# Patient Record
Sex: Male | Born: 1960
Health system: Southern US, Community
[De-identification: ages and names within clinical notes are randomized; demographics above are authoritative.]

## PROBLEM LIST (undated history)

## (undated) DIAGNOSIS — Z720 Tobacco use: Secondary | ICD-10-CM

## (undated) DIAGNOSIS — G709 Myoneural disorder, unspecified: Secondary | ICD-10-CM

## (undated) DIAGNOSIS — R918 Other nonspecific abnormal finding of lung field: Secondary | ICD-10-CM

## (undated) DIAGNOSIS — J449 Chronic obstructive pulmonary disease, unspecified: Secondary | ICD-10-CM

## (undated) DIAGNOSIS — K219 Gastro-esophageal reflux disease without esophagitis: Secondary | ICD-10-CM

## (undated) DIAGNOSIS — M549 Dorsalgia, unspecified: Secondary | ICD-10-CM

## (undated) DIAGNOSIS — J42 Unspecified chronic bronchitis: Secondary | ICD-10-CM

## (undated) HISTORY — PX: BACK SURGERY: SHX140

## (undated) HISTORY — DX: Dorsalgia, unspecified: M54.9

## (undated) HISTORY — DX: Tobacco use: Z72.0

## (undated) HISTORY — DX: Unspecified chronic bronchitis: J42

## (undated) HISTORY — DX: Other nonspecific abnormal finding of lung field: R91.8

---
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
## 2015-10-12 ENCOUNTER — Encounter (HOSPITAL_COMMUNITY): Payer: Self-pay | Admitting: *Deleted

## 2015-10-12 ENCOUNTER — Emergency Department (INDEPENDENT_AMBULATORY_CARE_PROVIDER_SITE_OTHER)
Admission: EM | Admit: 2015-10-12 | Discharge: 2015-10-12 | Disposition: A | Discharge status: Home / Self Care | Payer: BLUE CROSS/BLUE SHIELD | Source: Home / Self Care | Attending: Emergency Medicine | Admitting: Emergency Medicine

## 2015-10-12 ENCOUNTER — Emergency Department (INDEPENDENT_AMBULATORY_CARE_PROVIDER_SITE_OTHER): Payer: BLUE CROSS/BLUE SHIELD

## 2015-10-12 ENCOUNTER — Other Ambulatory Visit (HOSPITAL_COMMUNITY)
Admission: RE | Admit: 2015-10-12 | Discharge: 2015-10-12 | Disposition: A | Discharge status: Home / Self Care | Payer: BLUE CROSS/BLUE SHIELD | Source: Ambulatory Visit | Attending: Emergency Medicine | Admitting: Emergency Medicine

## 2015-10-12 DIAGNOSIS — R0789 Other chest pain: Secondary | ICD-10-CM | POA: Insufficient documentation

## 2015-10-12 DIAGNOSIS — R079 Chest pain, unspecified: Secondary | ICD-10-CM

## 2015-10-12 LAB — POCT RAPID STREP A: Streptococcus, Group A Screen (Direct): NEGATIVE

## 2015-10-12 MED ORDER — AZITHROMYCIN 250 MG PO TABS: 250.0000 mg | ORAL_TABLET | Freq: Every day | ORAL | Status: DC

## 2015-10-12 NOTE — ED Provider Notes (Signed)
CSN: [id number]     Arrival date & time 10/12/15  1641 History   First MD Initiated Contact with Patient 10/12/15 1708     Chief Complaint  Patient presents with  . Fever   (Consider location/radiation/quality/duration/timing/severity/associated sxs/prior Treatment) HPI History obtained from patient:   LOCATION:chest/throat SEVERITY:2 DURATION:1 week CONTEXT:sudden onset pain with swallowing and when taking a deep breath has a burning sensation in the middle of the chest. Last several seconds then goes away. QUALITY: MODIFYING FACTORS:none ASSOCIATED SYMPTOMS:hurts to swallow.  TIMING:constant OCCUPATION:  History reviewed. No pertinent past medical history. History reviewed. No pertinent past surgical history. History reviewed. No pertinent family history. Social History  Substance Use Topics  . Smoking status: Current Every Day Smoker  . Smokeless tobacco: None  . Alcohol Use: Yes    Review of Systems ROS +'ve  Denies: HEADACHE, NAUSEA, ABDOMINAL PAIN, CONGESTION, DYSURIA, SHORTNESS OF BREATH  Allergies  Codeine and Demerol  Home Medications   Prior to Admission medications   Medication Sig Start Date End Date Taking? Authorizing Provider  Acetaminophen (TYLENOL PO) Take by mouth.   Yes Historical Provider, MD  azithromycin (ZITHROMAX) 250 MG tablet Take 1 tablet (250 mg total) by mouth daily. Take first 2 tablets together, then 1 every day until finished. 10/12/15   Tharon AquasFrank C Maeven Mcdougall, PA   Meds Ordered and Administered this Visit  Medications - No data to display  BP 154/120 mmHg  Pulse 129  Temp(Src) 101.2 F (38.4 C) (Oral)  Resp 18  SpO2 100% No data found.   Physical Exam NURSES NOTES AND VITAL SIGNS REVIEWED. CONSTITUTIONAL: Well developed, well nourished, no acute distress HEENT: normocephalic, atraumatic, right and left TM's are normal EYES: Conjunctiva normal NECK:normal ROM, supple, no adenopathy PULMONARY:No respiratory distress, normal  effort, Lungs: CTAb/l, no wheezes, or increased work of breathing CARDIOVASCULAR: RRR, no murmur ABDOMEN: soft, ND, NT, +'ve BS MUSCULOSKELETAL: Normal ROM of all extremities,  SKIN: warm and dry without rash PSYCHIATRIC: Mood and affect, behavior are normal  ED Course  Procedures (including critical care time)  Labs Review Labs Reviewed  POCT RAPID STREP A    Imaging Review Dg Chest 2 View  10/12/2015  CLINICAL DATA:  Pain with deep breaths. Cough, fever, sore throat, and chest pain beginning last night. Smoker. EXAM: CHEST  2 VIEW COMPARISON:  None. FINDINGS: The cardiomediastinal silhouette is within normal limits. The lungs are hyperinflated. There is an approximately 1.2 cm nodular density projecting in the right upper lobe. No segmental airspace consolidation, edema, pleural effusion, pneumothorax is identified. No acute osseous abnormality is seen. IMPRESSION: 1. No evidence of acute cardiopulmonary process. 2. 1.2 cm right upper lobe nodule. Further evaluation with non urgent chest CT recommended. Electronically Signed   By: Sebastian AcheAllen  Grady M.D.   On: 10/12/2015 17:58     Visual Acuity Review  Right Eye Distance:   Left Eye Distance:   Bilateral Distance:    Right Eye Near:   Left Eye Near:    Bilateral Near:      REVIEWED CXR WITH PATIENT AND WIFE NEEDS FOLLOW UP WITH CT CHEST NON EMERGENT   MDM   1. Chest pain of uncertain etiology      Patient is reassured that there are no issues that require transfer to higher level of care at this time or additional tests. Patient is advised to continue home symptomatic treatment. Patient is advised that if there are new or worsening symptoms to attend the emergency department, contact primary  care provider, or return to UC. Instructions of care provided discharged home in stable condition. Return to work/school note provided.   THIS NOTE WAS GENERATED USING A VOICE RECOGNITION SOFTWARE PROGRAM. ALL REASONABLE EFFORTS  WERE  MADE TO PROOFREAD THIS DOCUMENT FOR ACCURACY.  I have verbally reviewed the discharge instructions with the patient. A printed AVS was given to the patient.  All questions were answered prior to discharge.      Tharon Aquas, PA 10/12/15 1820

## 2015-10-12 NOTE — Discharge Instructions (Signed)
FINDINGS: The cardiomediastinal silhouette is within normal limits. The lungs are hyperinflated. There is an approximately 1.2 cm nodular density projecting in the right upper lobe. No segmental airspace consolidation, edema, pleural effusion, pneumothorax is identified. No acute osseous abnormality is seen.  IMPRESSION: 1. No evidence of acute cardiopulmonary process. 2. 1.2 cm right upper lobe nodule. Further evaluation with non urgent chest CT recommended.  YOUR CHEST PAIN IS NON SPECIFIC. IT DOES NOT APPEAR TO BE FROM YOUR HEART AND YOUR STREP TEST IS NEGATIVE AS NOTED ABOVE ON YOUR CHEST X-RAY THERE IS A NODULE IN THE RIGHT UPPER LUNG.  THIS DOES NOT HAVE THE SHAPE OF CANCER BUT SHOULD BE EVALUATED FOR DEFINITIVE DIAGNOSIS. SLOW DOWN ON SMOKING MAY HELP YOUR SYMPTOMS ALSO.

## 2015-10-12 NOTE — ED Notes (Signed)
Pt  Reports      Symptoms  Of  Fever     As     Well  As      Pain  In  Throat  And  Chest     -  Symptoms       Began  Yesterday  Pain  When he   Takes  A  Deep breath  And  Or  Coughs

## 2015-10-15 LAB — CULTURE, GROUP A STREP (THRC)

## 2015-12-13 ENCOUNTER — Ambulatory Visit
Admission: RE | Admit: 2015-12-13 | Discharge: 2015-12-13 | Disposition: A | Discharge status: Home / Self Care | Payer: BLUE CROSS/BLUE SHIELD | Source: Ambulatory Visit | Attending: Family Medicine | Admitting: Family Medicine

## 2015-12-13 ENCOUNTER — Other Ambulatory Visit: Payer: Self-pay | Admitting: Family Medicine

## 2015-12-13 DIAGNOSIS — R911 Solitary pulmonary nodule: Secondary | ICD-10-CM | POA: Diagnosis not present

## 2015-12-13 DIAGNOSIS — J984 Other disorders of lung: Secondary | ICD-10-CM | POA: Diagnosis not present

## 2015-12-13 DIAGNOSIS — F17290 Nicotine dependence, other tobacco product, uncomplicated: Secondary | ICD-10-CM | POA: Diagnosis not present

## 2015-12-13 DIAGNOSIS — Z6820 Body mass index (BMI) 20.0-20.9, adult: Secondary | ICD-10-CM | POA: Diagnosis not present

## 2015-12-13 DIAGNOSIS — J42 Unspecified chronic bronchitis: Secondary | ICD-10-CM | POA: Diagnosis not present

## 2015-12-16 ENCOUNTER — Ambulatory Visit
Admission: RE | Admit: 2015-12-16 | Discharge: 2015-12-16 | Disposition: A | Discharge status: Home / Self Care | Payer: BLUE CROSS/BLUE SHIELD | Source: Ambulatory Visit | Attending: Family Medicine | Admitting: Family Medicine

## 2015-12-16 DIAGNOSIS — R918 Other nonspecific abnormal finding of lung field: Secondary | ICD-10-CM | POA: Diagnosis not present

## 2015-12-16 DIAGNOSIS — R911 Solitary pulmonary nodule: Secondary | ICD-10-CM

## 2015-12-16 MED ORDER — IOPAMIDOL (ISOVUE-300) INJECTION 61%
75.0000 mL | Freq: Once | INTRAVENOUS | Status: AC | PRN
  Administered 2015-12-16: 75 mL via INTRAVENOUS

## 2015-12-28 ENCOUNTER — Institutional Professional Consult (permissible substitution) (INDEPENDENT_AMBULATORY_CARE_PROVIDER_SITE_OTHER): Payer: BLUE CROSS/BLUE SHIELD | Admitting: Cardiothoracic Surgery

## 2015-12-28 ENCOUNTER — Encounter: Payer: Self-pay | Admitting: Cardiothoracic Surgery

## 2015-12-28 ENCOUNTER — Other Ambulatory Visit: Payer: Self-pay | Admitting: *Deleted

## 2015-12-28 VITALS — BP 165/98 | HR 88 | Resp 20 | Ht 71.0 in | Wt 154.0 lb

## 2015-12-28 DIAGNOSIS — R918 Other nonspecific abnormal finding of lung field: Secondary | ICD-10-CM | POA: Diagnosis not present

## 2015-12-28 NOTE — Progress Notes (Signed)
[address]       [place] [address]             [phone]                    [name] Ross [facility] Medical Record #[id number] Date of Birth: 10-14-1960  Referring: Lewis Moccasin, MD Primary Care: Maryelizabeth Rowan, MD  Chief Complaint:    Chief Complaint  Patient presents with  . Lung Mass    Surgical eval, Chest CT 12/18/15    History of Present Illness:    [name] Ross 55 y.o. male is seen in the office  today for Abnormal chest x-ray and CT scan. The patient had a chest x-ray in March of this year demonstrating a right upper lobe lung nodule. He was seen in urgent care because of cough and pulmonary complaints. Follow-up chest x-ray done in the primary care office in late May showed persistent of right upper lobe lung lesion, this is confirmed on CT scan. The patient is a long-term at least a pack a day for more than 30 years. He does have environmental/work exposure to dust but is not aware of any specific asbestos exposure. He denies any previous cardiac history, denies tuberculosis exposure.     Current Activity/ Functional Status:  Patient is independent with mobility/ambulation, transfers, ADL's, IADL's.   Zubrod Score: At the time of surgery this patient's most appropriate activity status/level should be described as:     0    Normal activity, no symptoms     1    Restricted in physical strenuous activity but ambulatory, able to do out light work     2    Ambulatory and capable of self care, unable to do work activities, up and about               >50 % of waking hours                                  3    Only limited self care, in bed greater than 50% of waking hours     4    Completely disabled, no self care, confined to bed or chair     5    Moribund   Past Medical History  Diagnosis Date  . Multiple lung nodules   . Back pain   . Tobacco abuse   . Chronic bronchitis (HCC)     No past surgical history  on file.  No family history on file.  Social History   Social History  . Marital Status: Single    Spouse Name: N/A  . Number of Children: N/A  . Years of Education: N/A   Occupational History  . Not on file.   Social History Main Topics  . Smoking status: Current Every Day Smoker  . Smokeless tobacco: Not on file  . Alcohol Use: Yes  . Drug Use: Not on file  . Sexual Activity: Not on file   Other Topics Concern  . Not on file   Social History Narrative    History  Smoking status  . Current Every Day Smoker  Smokeless tobacco  . Not on file    History  Alcohol Use  . Yes     Allergies  Allergen Reactions  . Codeine   . Demerol [Meperidine]  Current Outpatient Prescriptions  Medication Sig Dispense Refill  . Acetaminophen (TYLENOL PO) Take by mouth.     No current facility-administered medications for this visit.      Review of Systems:     Cardiac Review of Systems: Y or N  Chest Pain [  n  ]  Resting SOB [ n  ] Exertional SOB  [ n ]  Orthopnea [ n ]   Pedal Edema [n   ]    Palpitations [n  ] Syncope  [ n ]   Presyncope [   n]  General Review of Systems: [Y] = yes [  ]=no Constitional: recent weight change [  n];  Wt loss over the last 3 months [   ] anorexia [  ]; fatigue [  ]; nausea [  ]; night sweats [  ]; fever [  ]; or chills [  ];          Dental: poor dentition[  ]; Last Dentist visit: dentures  Eye : blurred vision [  ]; diplopia [   ]; vision changes [  ];  Amaurosis fugax[  ]; Resp: cough [  ];  wheezing[  ];  hemoptysis[  ]; shortness of breath[  ]; paroxysmal nocturnal dyspnea[  ]; dyspnea on exertion[  ]; or orthopnea[  ];  GI:  gallstones[  ], vomiting[  ];  dysphagia[  ]; melena[  ];  hematochezia [  ]; heartburn[  ];   Hx of  Colonoscopy[  ]; GU: kidney stones [  ]; hematuria[  ];   dysuria [  ];  nocturia[  ];  history of     obstruction [  ]; urinary frequency [  ]             Skin: rash, swelling[  ];, hair loss[  ];  peripheral  edema[  ];  or itching[  ]; Musculosketetal: myalgias[  ];  joint swelling[  ];  joint erythema[  ];  joint pain[  ];  back pain[  ];  Heme/Lymph: bruising[  ];  bleeding[  ];  anemia[  ];  Neuro: TIA[  ];  headaches[  ];  stroke[  ];  vertigo[  ];  seizures[  ];   paresthesias[  ];  difficulty walking[n  ];  Psych:depression[  ]; anxiety[  ];  Endocrine: diabetes[ n ];  thyroid dysfunction[n  ];  Immunizations: Flu up to date [?  ]; Pneumococcal up to date [ ? ];  Other:  Physical Exam: BP 165/98 mmHg  Pulse 88  Resp 20  Ht 5\' 11"  (1.803 m)  Wt 154 lb (69.854 kg)  BMI 21.49 kg/m2  SpO2 97%  PHYSICAL EXAMINATION: General appearance: alert, cooperative, appears stated age and no distress Head: Normocephalic, without obvious abnormality, atraumatic Neck: no adenopathy, no carotid bruit, no JVD, supple, symmetrical, trachea midline and thyroid not enlarged, symmetric, no tenderness/mass/nodules Lymph nodes: Cervical, supraclavicular, and axillary nodes normal. Resp: clear to auscultation bilaterally Back: symmetric, no curvature. ROM normal. No CVA tenderness. Cardio: regular rate and rhythm, S1, S2 normal, no murmur, click, rub or gallop GI: soft, non-tender; bowel sounds normal; no masses,  no organomegaly Extremities: extremities normal, atraumatic, no cyanosis or edema and Homans sign is negative, no sign of DVT Neurologic: Grossly normal  Diagnostic Studies & Laboratory data:     Recent Radiology Findings:   Dg Chest 2 View  12/13/2015  CLINICAL DATA:  Followup lung nodule, patient smokes EXAM: CHEST  2  VIEW COMPARISON:  10/12/2015 FINDINGS: Irregular 12 mm right upper lobe nodule persists. Heart size and vascular pattern normal. Left lung is clear. No pleural effusions. IMPRESSION: Persistent irregular 12 mm upper lobe pulmonary nodule on the right. CT thorax preferably with contrast is suggested. These results will be called to the ordering clinician or representative by the  Radiologist Assistant, and communication documented in the PACS or zVision Dashboard. Electronically Signed   By: Esperanza Heir M.D.   On: 12/13/2015 14:15   Ct Chest W Contrast  12/16/2015  CLINICAL DATA:  Lung nodule on recent chest x-ray EXAM: CT CHEST WITH CONTRAST TECHNIQUE: Multidetector CT imaging of the chest was performed during intravenous contrast administration. CONTRAST:  75mL ISOVUE-300 IOPAMIDOL (ISOVUE-300) INJECTION 61% COMPARISON:  12/13/2015 FINDINGS: The lungs are well aerated bilaterally. Diffuse emphysematous changes are identified. The left lung shows no evidence of focal infiltrate. The right lung demonstrates an 8 mm nodular density best seen on image number 69 of series 6 as well as a irregular spiculated lesion in the right upper lobe posteriorly which measures at least 2 cm in greatest dimension. Surrounding architectural distortion is noted in these changes are highly suspicious for underlying neoplasm. The thoracic inlet is within normal limits. Aortic calcifications are seen without aneurysmal dilatation or dissection. The pulmonary artery shows no large central pulmonary embolus. Scattered small mediastinal lymph nodes are seen. A single 10 mm short axis lymph node is noted in the right hilum. The visualized upper abdomen demonstrates cystic changes of the left kidney and liver. A 9 mm short axis portacaval node is noted as well. The osseous structures show no acute abnormality. IMPRESSION: Multiple nodules within the right upper lobe. The largest of these measures at least 2 cm with significant architectural distortion and spiculation. These changes are consistent with pulmonary neoplasm till proven otherwise. Tissue sampling and further workup with a PET-CT may be helpful. These results will be called to the ordering clinician or representative by the Radiologist Assistant, and communication documented in the PACS or zVision Dashboard. Electronically Signed   By: Alcide Clever  M.D.   On: 12/16/2015 16:37     I have independently reviewed the above radiology studies  and reviewed the findings with the patient.   Recent Lab Findings: No results found for: WBC, HGB, HCT, PLT, GLUCOSE, CHOL, TRIG, HDL, LDLDIRECT, LDLCALC, ALT, AST, NA, K, CL, CREATININE, BUN, CO2, TSH, INR, GLUF, HGBA1C    Assessment / Plan:   1. Multiple lung nodules right upper lobe and long-term smoker, suggestive of clinical stage IIb (two separate lung nodules same lobe), assuming no nodal involvement. I discussed with patient and his family and reviewed the films with them a probable diagnosis. I recommended that we proceed with PET scan to better stage and pulmonary function studies once this is completed we will then decide whether it proceed with primary surgery or tissue diagnosis other means. 2. Patient is a long-term smoker and was canceled about smoking sensation, his workplace provides smoking cessation program and he will enroll tomorrow.      I  spent 40 minutes counseling the patient face to face and 50% or more the  time was spent in counseling and coordination of care. The total time spent in the appointment was 60 minutes.  Delight Ovens MD      [address] [place] [place] [address] Office [phone]   Beeper [phone]  12/28/2015 3:46 PM

## 2015-12-28 NOTE — Patient Instructions (Signed)
Pulmonary Nodule A pulmonary nodule is a small, round growth of tissue in the lung. Pulmonary nodules can range in size from less than 1/5 inch (4 mm) to a little bigger than an inch (25 mm). Most pulmonary nodules are detected when imaging tests of the lung are being performed for a different problem. Pulmonary nodules are usually not cancerous (benign). However, some pulmonary nodules are cancerous (malignant). Follow-up treatment or testing is based on the size of the pulmonary nodule and your risk of getting lung cancer.  CAUSES Benign pulmonary nodules can be caused by various things. Some of the causes include:   Bacterial, fungal, or viral infections. This is usually an old infection that is no longer active, but it can sometimes be a current, active infection.  A benign mass of tissue.  Inflammation from conditions such as rheumatoid arthritis.   Abnormal blood vessels in the lungs. Malignant pulmonary nodules can result from lung cancer or from cancers that spread to the lung from other places in the body. SIGNS AND SYMPTOMS Pulmonary nodules usually do not cause symptoms. DIAGNOSIS Most often, pulmonary nodules are found incidentally when an X-ray or CT scan is performed to look for some other problem in the lung area. To help determine whether a pulmonary nodule is benign or malignant, your health care provider will take a medical history and order a variety of tests. Tests done may include:   Blood tests.  A skin test called a tuberculin test. This test is used to determine if you have been exposed to the germ that causes tuberculosis.   Chest X-rays. If possible, a new X-ray may be compared with X-rays you have had in the past.   CT scan. This test shows smaller pulmonary nodules more clearly than an X-ray.   Positron emission tomography (PET) scan. In this test, a safe amount of a radioactive substance is injected into the bloodstream. Then, the scan takes a picture of  the pulmonary nodule. The radioactive substance is eliminated from your body in your urine.   Biopsy. A tiny piece of the pulmonary nodule is removed so it can be checked under a microscope. TREATMENT  Pulmonary nodules that are benign normally do not require any treatment because they usually do not cause symptoms or breathing problems. Your health care provider may want to monitor the pulmonary nodule through follow-up CT scans. The frequency of these CT scans will vary based on the size of the nodule and the risk factors for lung cancer. For example, CT scans will need to be done more frequently if the pulmonary nodule is larger and if you have a history of smoking and a family history of cancer. Further testing or biopsies may be done if any follow-up CT scan shows that the size of the pulmonary nodule has increased. HOME CARE INSTRUCTIONS  Only take over-the-counter or prescription medicines as directed by your health care provider.  Keep all follow-up appointments with your health care provider. SEEK MEDICAL CARE IF:  You have trouble breathing when you are active.   You feel sick or unusually tired.   You do not feel like eating.   You lose weight without trying to.   You develop chills or night sweats.  SEEK IMMEDIATE MEDICAL CARE IF:  You cannot catch your breath, or you begin wheezing.   You cannot stop coughing.   You cough up blood.   You become dizzy or feel like you are going to pass out.   You   have sudden chest pain.   You have a fever or persistent symptoms for more than 2-3 days.   You have a fever and your symptoms suddenly get worse. MAKE SURE YOU:  Understand these instructions.  Will watch your condition.  Will get help right away if you are not doing well or get worse.   This information is not intended to replace advice given to you by your health care provider. Make sure you discuss any questions you have with your health care  provider.   Document Released: 05/06/2009 Document Revised: 03/11/2013 Document Reviewed: 12/29/2012 Elsevier Interactive Patient Education 2016 [employer]   Lung Cancer Lung cancer occurs when abnormal cells in the lung grow out of control and form a mass (tumor). There are several types of lung cancer. The two most common types are:  Non-small cell. In this type of lung cancer, abnormal cells are larger and grow more slowly than those of small cell lung cancer.  Small cell. In this type of lung cancer, abnormal cells are smaller than those of non-small cell lung cancer. Small cell lung cancer gets worse faster than non-small cell lung cancer. CAUSES  The leading cause of lung cancer is smoking tobacco. The second leading cause is radon exposure. RISK FACTORS  Smoking tobacco.  Exposure to secondhand tobacco smoke.  Exposure to radon gas.  Exposure to asbestos.  Exposure to arsenic in drinking water.  Air pollution.  Family or personal history of lung cancer.  Lung radiation therapy.  Being older than 65 years. SIGNS AND SYMPTOMS  In the early stages, symptoms may not be present. As the cancer progresses, symptoms may include:  A lasting cough, possibly with blood.  Fatigue.  Unexplained weight loss.  Shortness of breath.  Wheezing.  Chest pain.  Loss of appetite. Symptoms of advanced lung cancer include:  Hoarseness.  Bone or joint pain.  Weakness.  Nail problems.  Face or arm swelling.  Paralysis of the face.  Drooping eyelids. DIAGNOSIS  Lung cancer can be identified with a physical exam and with tests such as:  A chest X-ray.  A CT scan.  Blood tests.  A biopsy. After a diagnosis is made, you will have more tests to determine the stage of the cancer. The stages of non-small cell lung cancer are:  Stage 0, also called carcinoma in situ. At this stage, abnormal cells are found in the inner lining of your lung or lungs.  Stage I. At  this stage, abnormal cells have grown into a tumor that is no larger than 5 cm across. The cancer has entered the deeper lung tissue but has not yet entered the lymph nodes or other parts of the body.  Stage II. At this stage, the tumor is 7 cm across or smaller and has entered nearby lymph nodes. Or, the tumor is 5 cm across or smaller and has invaded surrounding tissue but is not found in nearby lymph nodes. There may be more than one tumor present.  Stage III. At this stage, the tumor may be any size. There may be more than one tumor in the lungs. The cancer cells have spread to the lymph nodes and possibly to other organs.  Stage IV. At this stage, there are tumors in both lungs and the cancer has spread to other areas of the body. The stages of small cell lung cancer are:  Limited. At this stage, the cancer is found only on one side of the chest.  Extensive. At   this stage, the cancer is in the lungs and in tissues on the other side of the chest. The cancer has spread to other organs or is found in the fluid between the layers of your lungs. TREATMENT  Depending on the type and stage of your lung cancer, you may be treated with:  Surgery. This is done to remove a tumor.  Radiation therapy. This treatment destroys cancer cells using X-rays or other types of radiation.  Chemotherapy. This treatment uses medicines to destroy cancer cells.  Targeted therapy. This treatment aims to destroy only cancer cells instead of all cells as other therapies do. You may also have a combination of treatments. HOME CARE INSTRUCTIONS   Do not use any tobacco products. This includes cigarettes, chewing tobacco, and electronic cigarettes. If you need help quitting, ask your health care provider.  Take medicines only as directed by your health care provider.  Eat a healthy diet. Work with a dietitian to make sure you are getting the nutrition you need.  Consider joining a support group or seeking  counseling to help you cope with the stress of having lung cancer.  Let your cancer specialist (oncologist) know if you are admitted to the hospital.  Keep all follow-up visits as directed by your health care provider. This is important. SEEK MEDICAL CARE IF:   You lose weight without trying.  You have a persistent cough and wheezing.  You feel short of breath.  You tire easily.  You experience bone or joint pain.  You have difficulty swallowing.  You feel hoarse or notice your voice changing.  Your pain medicine is not helping. SEEK IMMEDIATE MEDICAL CARE IF:   You cough up blood.  You have new breathing problems.  You develop chest pain.  You develop swelling in:  One or both ankles or legs.  Your face, neck, or arms.  You are confused.  You experience paralysis in your face or a drooping eyelid.   This information is not intended to replace advice given to you by your health care provider. Make sure you discuss any questions you have with your health care provider.   Document Released: 10/15/2000 Document Revised: 03/30/2015 Document Reviewed: 11/12/2013 Elsevier Interactive Patient Education 2016 [employer] Inc[employer]    

## 2015-12-30 ENCOUNTER — Encounter (HOSPITAL_COMMUNITY): Payer: BLUE CROSS/BLUE SHIELD

## 2016-01-03 ENCOUNTER — Ambulatory Visit: Payer: BLUE CROSS/BLUE SHIELD | Admitting: Cardiothoracic Surgery

## 2016-01-06 ENCOUNTER — Ambulatory Visit (HOSPITAL_COMMUNITY)
Admission: RE | Admit: 2016-01-06 | Discharge: 2016-01-06 | Disposition: A | Discharge status: Home / Self Care | Payer: BLUE CROSS/BLUE SHIELD | Source: Ambulatory Visit | Attending: Cardiothoracic Surgery | Admitting: Cardiothoracic Surgery

## 2016-01-06 ENCOUNTER — Ambulatory Visit (HOSPITAL_COMMUNITY): Payer: BLUE CROSS/BLUE SHIELD

## 2016-01-06 DIAGNOSIS — R918 Other nonspecific abnormal finding of lung field: Secondary | ICD-10-CM | POA: Insufficient documentation

## 2016-01-06 LAB — PULMONARY FUNCTION TEST
DL/VA % pred: 61 %
DL/VA: 2.9 ml/min/mmHg/L
DLCO unc % pred: 58 %
DLCO unc: 19.72 ml/min/mmHg
FEF 25-75 Post: 2.38 L/sec
FEF 25-75 Pre: 1.35 L/sec
FEF2575-%Change-Post: 75 %
FEF2575-%Pred-Post: 73 %
FEF2575-%Pred-Pre: 41 %
FEV1-%Change-Post: 18 %
FEV1-%Pred-Post: 72 %
FEV1-%Pred-Pre: 60 %
FEV1-Post: 2.78 L
FEV1-Pre: 2.34 L
FEV1FVC-%Change-Post: -6 %
FEV1FVC-%Pred-Pre: 86 %
FEV6-%Change-Post: 16 %
FEV6-%Pred-Post: 84 %
FEV6-%Pred-Pre: 72 %
FEV6-Post: 4.08 L
FEV6-Pre: 3.51 L
FEV6FVC-%Change-Post: -8 %
FEV6FVC-%Pred-Post: 93 %
FEV6FVC-%Pred-Pre: 102 %
FVC-%Change-Post: 27 %
FVC-%Pred-Post: 89 %
FVC-%Pred-Pre: 70 %
FVC-Post: 4.52 L
FVC-Pre: 3.56 L
Post FEV1/FVC ratio: 61 %
Post FEV6/FVC ratio: 90 %
Pre FEV1/FVC ratio: 66 %
Pre FEV6/FVC Ratio: 98 %
RV % pred: 134 %
RV: 2.98 L
TLC % pred: 98 %
TLC: 7.09 L

## 2016-01-06 MED ORDER — ALBUTEROL SULFATE (2.5 MG/3ML) 0.083% IN NEBU
2.5000 mg | INHALATION_SOLUTION | Freq: Once | RESPIRATORY_TRACT | Status: AC
  Administered 2016-01-06: 2.5 mg via RESPIRATORY_TRACT

## 2016-01-09 ENCOUNTER — Ambulatory Visit: Payer: BLUE CROSS/BLUE SHIELD | Admitting: Cardiothoracic Surgery

## 2016-01-10 ENCOUNTER — Ambulatory Visit
Admission: RE | Admit: 2016-01-10 | Discharge: 2016-01-10 | Disposition: A | Discharge status: Home / Self Care | Payer: BLUE CROSS/BLUE SHIELD | Source: Ambulatory Visit | Attending: Cardiothoracic Surgery | Admitting: Cardiothoracic Surgery

## 2016-01-10 DIAGNOSIS — K5790 Diverticulosis of intestine, part unspecified, without perforation or abscess without bleeding: Secondary | ICD-10-CM | POA: Insufficient documentation

## 2016-01-10 DIAGNOSIS — R918 Other nonspecific abnormal finding of lung field: Secondary | ICD-10-CM | POA: Insufficient documentation

## 2016-01-10 DIAGNOSIS — I700 Atherosclerosis of aorta: Secondary | ICD-10-CM | POA: Insufficient documentation

## 2016-01-10 DIAGNOSIS — R933 Abnormal findings on diagnostic imaging of other parts of digestive tract: Secondary | ICD-10-CM | POA: Insufficient documentation

## 2016-01-10 LAB — GLUCOSE, CAPILLARY: Glucose-Capillary: 91 mg/dL (ref 65–99)

## 2016-01-10 MED ORDER — FLUDEOXYGLUCOSE F - 18 (FDG) INJECTION
12.9900 | Freq: Once | INTRAVENOUS | Status: AC | PRN
  Administered 2016-01-10: 12.99 via INTRAVENOUS

## 2016-01-12 ENCOUNTER — Encounter: Payer: Self-pay | Admitting: Cardiothoracic Surgery

## 2016-01-12 ENCOUNTER — Ambulatory Visit (INDEPENDENT_AMBULATORY_CARE_PROVIDER_SITE_OTHER): Payer: BLUE CROSS/BLUE SHIELD | Admitting: Cardiothoracic Surgery

## 2016-01-12 VITALS — BP 156/85 | HR 81 | Resp 16 | Ht 71.0 in | Wt 154.0 lb

## 2016-01-12 DIAGNOSIS — R918 Other nonspecific abnormal finding of lung field: Secondary | ICD-10-CM | POA: Diagnosis not present

## 2016-01-12 NOTE — Progress Notes (Signed)
[address]       [place] [address]             [phone]                    [name] Ross [facility] Medical Record #[id number] Date of Birth: 1961/02/18  Referring: Lewis Moccasin, MD Primary Care: Maryelizabeth Rowan, MD  Chief Complaint:    Chief Complaint  Patient presents with  . Lung Mass    further discuss surgery, PET Scan 01/10/16, PFT's 01/07/16     History of Present Illness:    [name] Ross 55 y.o. male is seen in the office  today for Abnormal chest x-ray and CT scan. The patient had a chest x-ray in March of this year demonstrating a right upper lobe lung nodule. He was seen in urgent care because of cough and pulmonary complaints. Follow-up chest x-ray done in the primary care office in late May showed persistent of right upper lobe lung lesion, this is confirmed on CT scan. The patient is a long-term at least a pack a day for more than 30 years. He does have environmental/work exposure to dust but is not aware of any specific asbestos exposure. He denies any previous cardiac history, denies tuberculosis exposure.     Current Activity/ Functional Status:  Patient is independent with mobility/ambulation, transfers, ADL's, IADL's.   Zubrod Score: At the time of surgery this patient's most appropriate activity status/level should be described as: [x]     0    Normal activity, no symptoms []     1    Restricted in physical strenuous activity but ambulatory, able to do out light work []     2    Ambulatory and capable of self care, unable to do work activities, up and about               >50 % of waking hours                              []     3    Only limited self care, in bed greater than 50% of waking hours []     4    Completely disabled, no self care, confined to bed or chair []     5    Moribund   Past Medical History  Diagnosis Date  . Multiple lung nodules   . Back pain   . Tobacco abuse   . Chronic bronchitis (HCC)      No past surgical history on file.  No family history on file.  Social History   Social History  . Marital Status: Single    Spouse Name: N/A  . Number of Children: N/A  . Years of Education: N/A   Occupational History  . Not on file.   Social History Main Topics  . Smoking status: Current Every Day Smoker  . Smokeless tobacco: Not on file  . Alcohol Use: Yes  . Drug Use: Not on file  . Sexual Activity: Not on file   Other Topics Concern  . Not on file   Social History Narrative    History  Smoking status  . Current Every Day Smoker  Smokeless tobacco  . Not on file    History  Alcohol Use  . Yes     Allergies  Allergen Reactions  . Codeine   . Demerol [  Meperidine]     Current Outpatient Prescriptions  Medication Sig Dispense Refill  . Acetaminophen (TYLENOL PO) Take by mouth.     No current facility-administered medications for this visit.      Review of Systems:     Cardiac Review of Systems: Y or N  Chest Pain [  n  ]  Resting SOB [ n  ] Exertional SOB  [ n ]  Orthopnea [ n ]   Pedal Edema [n   ]    Palpitations [n  ] Syncope  [ n ]   Presyncope [   n]  General Review of Systems: [Y] = yes [  ]=no Constitional: recent weight change [  n];  Wt loss over the last 3 months [   ] anorexia [  ]; fatigue [  ]; nausea [  ]; night sweats [  ]; fever [  ]; or chills [  ];          Dental: poor dentition[  ]; Last Dentist visit: dentures  Eye : blurred vision [  ]; diplopia [   ]; vision changes [  ];  Amaurosis fugax[  ]; Resp: cough [  ];  wheezing[  ];  hemoptysis[  ]; shortness of breath[  ]; paroxysmal nocturnal dyspnea[  ]; dyspnea on exertion[  ]; or orthopnea[  ];  GI:  gallstones[  ], vomiting[  ];  dysphagia[  ]; melena[  ];  hematochezia [  ]; heartburn[  ];   Hx of  Colonoscopy[  ]; GU: kidney stones [  ]; hematuria[  ];   dysuria [  ];  nocturia[  ];  history of     obstruction [  ]; urinary frequency [  ]             Skin: rash, swelling[   ];, hair loss[  ];  peripheral edema[  ];  or itching[  ]; Musculosketetal: myalgias[  ];  joint swelling[  ];  joint erythema[  ];  joint pain[  ];  back pain[  ];  Heme/Lymph: bruising[  ];  bleeding[  ];  anemia[  ];  Neuro: TIA[  ];  headaches[  ];  stroke[  ];  vertigo[  ];  seizures[  ];   paresthesias[  ];  difficulty walking[n  ];  Psych:depression[  ]; anxiety[  ];  Endocrine: diabetes[ n ];  thyroid dysfunction[n  ];  Immunizations: Flu up to date [?  ]; Pneumococcal up to date [ ? ];  Other:  Physical Exam: BP 156/85 mmHg  Pulse 81  Resp 16  Ht 5\' 11"  (1.803 m)  Wt 154 lb (69.854 kg)  BMI 21.49 kg/m2  SpO2 97%  PHYSICAL EXAMINATION: General appearance: alert, cooperative, appears stated age and no distress Head: Normocephalic, without obvious abnormality, atraumatic Neck: no adenopathy, no carotid bruit, no JVD, supple, symmetrical, trachea midline and thyroid not enlarged, symmetric, no tenderness/mass/nodules Lymph nodes: Cervical, supraclavicular, and axillary nodes normal. Resp: clear to auscultation bilaterally Back: symmetric, no curvature. ROM normal. No CVA tenderness. Cardio: regular rate and rhythm, S1, S2 normal, no murmur, click, rub or gallop GI: soft, non-tender; bowel sounds normal; no masses,  no organomegaly Extremities: extremities normal, atraumatic, no cyanosis or edema and Homans sign is negative, no sign of DVT Neurologic: Grossly normal  Diagnostic Studies & Laboratory data:     Recent Radiology Findings: Nm Pet Image Initial (pi) Skull Base To Thigh  01/10/2016  CLINICAL DATA:  Initial  treatment strategy for 2 discrete right upper lobe pulmonary nodules. EXAM: NUCLEAR MEDICINE PET SKULL BASE TO THIGH TECHNIQUE: 13.0 mCi F-18 FDG was injected intravenously. Full-ring PET imaging was performed from the skull base to thigh after the radiotracer. CT data was obtained and used for attenuation correction and anatomic localization. FASTING BLOOD GLUCOSE:   Value: 91 mg/dl COMPARISON:  CT scan 60/45/409805/26/2017 FINDINGS: NECK No hypermetabolic lymph nodes in the neck. CHEST Spiculated posterior right upper lobe nodule is again identified. This lesion is the nodule measured at about 2 cm on the previous diagnostic CT and shows low level FDG accumulation with SUV max = 1.9. The second smaller pulmonary nodule is seen more caudally in the right upper lobe measuring up to about 8 mm. No detectable FDG uptake in this smaller nodule. No evidence for hypermetabolic metastatic disease in the mediastinum or hilar regions. Insert calcium heart Emphysema again noted in the lungs bilaterally. ABDOMEN/PELVIS No abnormal hypermetabolic activity within the liver, pancreas, adrenal glands, or spleen. No hypermetabolic lymph nodes in the abdomen or pelvis. There is abdominal aortic atherosclerosis without aneurysm. Abdominal aorta measures up to 2.8 cm in diameter. 7.0 cm exophytic water density lesion upper pole left kidney is compatible with a cyst in shows no hypermetabolism on PET imaging. The colon does not show diffuse FDG mural uptake on today's study, but there is a single discrete nodular focus of FDG accumulation in the mid to distal sigmoid colon with SUV max = 5.3. This is in a background of diffuse left colonic diverticulosis. SKELETON No focal hypermetabolic activity to suggest skeletal metastasis. IMPRESSION: 1. The larger, more cranial spiculated pulmonary nodule in the right upper lobe shows low level FDG uptake. Well differentiated or low-grade neoplasm remains a concern. 2. 8 mm nodule also identified in the right upper lobe is without evidence of discernible FDG accumulation, but this may well be related to the small size of the nodule. 3. No evidence for hypermetabolic metastatic disease in the chest, abdomen, or pelvis. 4. A single nodular focus of FDG accumulation in the colon is identified in the mid to distal sigmoid segment, in a region of fairly prominent  diverticulosis. Given the focality of this uptake, a discrete area of colonic inflammation or colonic adenoma/carcinoma would be considerations. Correlation with colorectal cancer screening history recommended. 5. Abdominal aortic atherosclerosis. Electronically Signed   By: Kennith CenterEric  Mansell M.D.   On: 01/10/2016 14:24      Dg Chest 2 View  12/13/2015  CLINICAL DATA:  Followup lung nodule, patient smokes EXAM: CHEST  2 VIEW COMPARISON:  10/12/2015 FINDINGS: Irregular 12 mm right upper lobe nodule persists. Heart size and vascular pattern normal. Left lung is clear. No pleural effusions. IMPRESSION: Persistent irregular 12 mm upper lobe pulmonary nodule on the right. CT thorax preferably with contrast is suggested. These results will be called to the ordering clinician or representative by the Radiologist Assistant, and communication documented in the PACS or zVision Dashboard. Electronically Signed   By: Esperanza Heiraymond  Rubner M.D.   On: 12/13/2015 14:15   Ct Chest W Contrast  12/16/2015  CLINICAL DATA:  Lung nodule on recent chest x-ray EXAM: CT CHEST WITH CONTRAST TECHNIQUE: Multidetector CT imaging of the chest was performed during intravenous contrast administration. CONTRAST:  75mL ISOVUE-300 IOPAMIDOL (ISOVUE-300) INJECTION 61% COMPARISON:  12/13/2015 FINDINGS: The lungs are well aerated bilaterally. Diffuse emphysematous changes are identified. The left lung shows no evidence of focal infiltrate. The right lung demonstrates an 8 mm nodular density  best seen on image number 69 of series 6 as well as a irregular spiculated lesion in the right upper lobe posteriorly which measures at least 2 cm in greatest dimension. Surrounding architectural distortion is noted in these changes are highly suspicious for underlying neoplasm. The thoracic inlet is within normal limits. Aortic calcifications are seen without aneurysmal dilatation or dissection. The pulmonary artery shows no large central pulmonary embolus. Scattered  small mediastinal lymph nodes are seen. A single 10 mm short axis lymph node is noted in the right hilum. The visualized upper abdomen demonstrates cystic changes of the left kidney and liver. A 9 mm short axis portacaval node is noted as well. The osseous structures show no acute abnormality. IMPRESSION: Multiple nodules within the right upper lobe. The largest of these measures at least 2 cm with significant architectural distortion and spiculation. These changes are consistent with pulmonary neoplasm till proven otherwise. Tissue sampling and further workup with a PET-CT may be helpful. These results will be called to the ordering clinician or representative by the Radiologist Assistant, and communication documented in the PACS or zVision Dashboard. Electronically Signed   By: Alcide Clever M.D.   On: 12/16/2015 16:37     I have independently reviewed the above radiology studies  and reviewed the findings with the patient.  Interpretation: The FVC, FEV1, FEV1/FVC ratio and FEF25-75% are reduced indicating airway obstruction. The FVC is reduced relative to the SVC indicating air trapping. . While the vital capacity and total lung capacity are within normal limits, the RV/TLC ratio is increased. Following administration of bronchodilators, there is a significant response indicated by the increased FVC. The reduced diffusing capacity indicates a moderate loss of functional alveolar capillary surface. However, the diffusing capacity was not corrected for the patient's hemoglobin. Pulmonary Function Diagnosis: Moderate Obstructive Airways Disease with reversibiility Restriction -Probable Moderate Diffusion Defect  FEV1= 2.34 60% DLCO 19.73 58%   Recent Lab Findings: No results found for: WBC, HGB, HCT, PLT, GLUCOSE, CHOL, TRIG, HDL, LDLDIRECT, LDLCALC, ALT, AST, NA, K, CL, CREATININE, BUN, CO2, TSH, INR, GLUF, HGBA1C    Assessment / Plan:   1. Multiple lung nodules right upper lobe and  long-term smoker, suggestive of clinical stage IIb (two separate lung nodules same lobe), assuming no nodal involvement. Pulmonary function studies have been done demonstrated approximately 60% predicted FEV1 and DLCO. The patient has significantly cut down on his smoking. The cranial spiculated pulmonary nodule in the right upper lobe shows low level FDG uptake. Well differentiated or low-grade neoplasm remains a concern, the second 8 mm nodule also identified in the right upper lobe is without evidence of discernible FDG accumulation, but this may well be related to the small size of the nodule.  There is No evidence for hypermetabolic metastatic disease in the chest, abdomen, or pelvis. I discussed in detail with the patient the possibility of low grade malignancy involving the right upper lobe we discussed receding with surgical resection. Because of family concerns job concerns the patient prefers to wait before resection he is agreeable to proceeding with a follow-up CT scan in 2 months( 3 months after original CT) and at that time proceed with either biopsy or surgical resection. Scan at that time will be done in the super D format  A single nodular focus of FDG accumulation in the colon is identified in the mid to distal sigmoid segment, in a region of fairly prominent diverticulosis. Given the focality of this uptake, a discrete area of colonic  inflammation or colonic adenoma/carcinoma would be considerations. Correlation with colorectal cancer screening history recommended. Patient is referred to GI to consider colonoscopy. He notes previous colonoscopy 12 years ago but has had no further follow-up since that time  I  spent 40 minutes counseling the patient face to face and 50% or more the  time was spent in counseling and coordination of care. The total time spent in the appointment was 60 minutes.  Delight Ovens MD      [address] [place] [place] [address] Office  [phone]   Beeper [phone]  01/12/2016 10:57 AM

## 2016-01-13 DIAGNOSIS — R918 Other nonspecific abnormal finding of lung field: Secondary | ICD-10-CM | POA: Diagnosis not present

## 2016-01-13 DIAGNOSIS — R948 Abnormal results of function studies of other organs and systems: Secondary | ICD-10-CM | POA: Diagnosis not present

## 2016-01-19 DIAGNOSIS — R933 Abnormal findings on diagnostic imaging of other parts of digestive tract: Secondary | ICD-10-CM | POA: Diagnosis not present

## 2016-01-19 DIAGNOSIS — K5792 Diverticulitis of intestine, part unspecified, without perforation or abscess without bleeding: Secondary | ICD-10-CM | POA: Diagnosis not present

## 2016-01-31 ENCOUNTER — Other Ambulatory Visit: Payer: Self-pay | Admitting: *Deleted

## 2016-01-31 DIAGNOSIS — R918 Other nonspecific abnormal finding of lung field: Secondary | ICD-10-CM

## 2016-03-22 ENCOUNTER — Encounter: Payer: Self-pay | Admitting: Cardiothoracic Surgery

## 2016-03-22 ENCOUNTER — Other Ambulatory Visit: Payer: Self-pay | Admitting: *Deleted

## 2016-03-22 ENCOUNTER — Ambulatory Visit
Admission: RE | Admit: 2016-03-22 | Discharge: 2016-03-22 | Disposition: A | Discharge status: Home / Self Care | Payer: BLUE CROSS/BLUE SHIELD | Source: Ambulatory Visit | Attending: Cardiothoracic Surgery | Admitting: Cardiothoracic Surgery

## 2016-03-22 ENCOUNTER — Ambulatory Visit (INDEPENDENT_AMBULATORY_CARE_PROVIDER_SITE_OTHER): Payer: BLUE CROSS/BLUE SHIELD | Admitting: Cardiothoracic Surgery

## 2016-03-22 VITALS — BP 155/98 | HR 72 | Resp 20 | Ht 71.0 in | Wt 154.0 lb

## 2016-03-22 DIAGNOSIS — R918 Other nonspecific abnormal finding of lung field: Secondary | ICD-10-CM | POA: Diagnosis not present

## 2016-03-22 DIAGNOSIS — R911 Solitary pulmonary nodule: Secondary | ICD-10-CM | POA: Diagnosis not present

## 2016-03-22 NOTE — Progress Notes (Addendum)
[address]       [place] [address]             [phone]                    [name] Medical Record #[id number] Date of Birth: 1961-05-14  Referring: Lewis Moccasin, MD Primary Care: Maryelizabeth Rowan, MD  Chief Complaint:    Chief Complaint  Patient presents with  . Lung Mass    2 month f/u with Chest CT- Super-D     History of Present Illness:    [name] Ross 55 y.o. male is seen in the office  Two months ago  for Abnormal chest x-ray and CT scan. The patient had a chest x-ray in March of this year demonstrating a right upper lobe lung nodule. He was seen in urgent care because of cough and pulmonary complaints. Follow-up chest x-ray done in the primary care office in late May showed persistent of right upper lobe lung lesion, this is confirmed on CT scan. The patient is a long-term at least a pack a day for more than 30 years. Since previously seen he has significantly cut down on his smoking but not stopped.  He does have environmental/work exposure to dust but is not aware of any specific asbestos exposure. He denies any previous cardiac history, denies tuberculosis exposure.  Although resection was recommended to the patient 2 months ago he wished to wait and have a follow-up scan   Current Activity/ Functional Status:  Patient is independent with mobility/ambulation, transfers, ADL's, IADL's.   Zubrod Score: At the time of surgery this patient's most appropriate activity status/level should be described as: [x]     0    Normal activity, no symptoms []     1    Restricted in physical strenuous activity but ambulatory, able to do out light work []     2    Ambulatory and capable of self care, unable to do work activities, up and about               >50 % of waking hours                              []     3    Only limited self care, in bed greater than 50% of waking hours []     4    Completely disabled, no self care,  confined to bed or chair []     5    Moribund   Past Medical History:  Diagnosis Date  . Back pain   . Chronic bronchitis (HCC)   . Multiple lung nodules   . Tobacco abuse     No past surgical history on file.  No family history on file.  Social History   Social History  . Marital status: Single    Spouse name: N/A  . Number of children: N/A  . Years of education: N/A   Occupational History  . Not on file.   Social History Main Topics  . Smoking status: Current Every Day Smoker  . Smokeless tobacco: Not on file  . Alcohol use Yes  . Drug use: Unknown  . Sexual activity: Not on file   Other Topics Concern  . Not on file   Social History Narrative  . No narrative on file    History  Smoking Status  .  Current Every Day Smoker  Smokeless Tobacco  . Not on file    History  Alcohol Use  . Yes     Allergies  Allergen Reactions  . Codeine   . Demerol [Meperidine]     Current Outpatient Prescriptions  Medication Sig Dispense Refill  . Acetaminophen (TYLENOL PO) Take by mouth.     No current facility-administered medications for this visit.       Review of Systems:     Cardiac Review of Systems: Y or N  Chest Pain [  n  ]  Resting SOB [ n  ] Exertional SOB  [ n ]  Orthopnea [ n ]   Pedal Edema [n   ]    Palpitations [n  ] Syncope  [ n ]   Presyncope [   n]  General Review of Systems: [Y] = yes [  ]=no Constitional: recent weight change [  n];  Wt loss over the last 3 months [   ] anorexia [  ]; fatigue [  ]; nausea [  ]; night sweats [  ]; fever [  ]; or chills [  ];          Dental: poor dentition[  ]; Last Dentist visit: dentures  Eye : blurred vision [  ]; diplopia [   ]; vision changes [  ];  Amaurosis fugax[  ]; Resp: cough [  ];  wheezing[  ];  hemoptysis[  ]; shortness of breath[  ]; paroxysmal nocturnal dyspnea[  ]; dyspnea on exertion[  ]; or orthopnea[  ];  GI:  gallstones[  ], vomiting[  ];  dysphagia[  ]; melena[  ];  hematochezia [  ];  heartburn[  ];   Hx of  Colonoscopy[  ]; GU: kidney stones [  ]; hematuria[  ];   dysuria [  ];  nocturia[  ];  history of     obstruction [  ]; urinary frequency [  ]             Skin: rash, swelling[  ];, hair loss[  ];  peripheral edema[  ];  or itching[  ]; Musculosketetal: myalgias[  ];  joint swelling[  ];  joint erythema[  ];  joint pain[  ];  back pain[  ];  Heme/Lymph: bruising[  ];  bleeding[  ];  anemia[  ];  Neuro: TIA[  ];  headaches[  ];  stroke[  ];  vertigo[  ];  seizures[  ];   paresthesias[  ];  difficulty walking[n  ];  Psych:depression[  ]; anxiety[  ];  Endocrine: diabetes[ n ];  thyroid dysfunction[n  ];  Immunizations: Flu up to date [?  ]; Pneumococcal up to date [ ? ];  Other:  Physical Exam: BP (!) 155/98 (BP Location: Left Arm, Cuff Size: Normal)   Pulse 72   Resp 20   Ht 5\' 11"  (1.803 m)   Wt 154 lb (69.9 kg)   SpO2 98%   BMI 21.48 kg/m   PHYSICAL EXAMINATION: General appearance: alert, cooperative, appears stated age and no distress Head: Normocephalic, without obvious abnormality, atraumatic Neck: no adenopathy, no carotid bruit, no JVD, supple, symmetrical, trachea midline and thyroid not enlarged, symmetric, no tenderness/mass/nodules Lymph nodes: Cervical, supraclavicular, and axillary nodes normal. Resp: clear to auscultation bilaterally Back: symmetric, no curvature. ROM normal. No CVA tenderness. Cardio: regular rate and rhythm, S1, S2 normal, no murmur, click, rub or gallop GI: soft, non-tender; bowel sounds normal; no masses,  no organomegaly Extremities: extremities normal, atraumatic, no cyanosis or edema and Homans sign is negative, no sign of DVT Neurologic: Grossly normal  Diagnostic Studies & Laboratory data:     Recent Radiology Findings: Ct Super D Chest Wo Contrast  Result Date: 03/22/2016 CLINICAL DATA:  Preop for right lung biopsy. EXAM: CT CHEST WITHOUT CONTRAST TECHNIQUE: Multidetector CT imaging of the chest was performed  using thin slice collimation for electromagnetic bronchoscopy planning purposes, without intravenous contrast. COMPARISON:  Chest CT 12/16/2015 and PET-CT 01/10/2016 FINDINGS: Chest wall: No chest wall mass, supraclavicular or axillary lymphadenopathy. The thyroid gland is normal. Cardiovascular: The heart is normal in size. No pericardial effusion. Stable tortuosity and calcification of the thoracic aorta. Stable coronary artery calcifications. Mediastinum/Nodes: No new mediastinal or hilar mass or adenopathy. Small scattered lymph nodes are stable. These were not metabolically active on the PET-CT. The esophagus is grossly normal. Lungs/Pleura: Stable advanced emphysematous changes and areas of pulmonary scarring. The irregular right upper lobe density is stable. It measures a maximum of 31.5 x 24 mm on the sagittal sequence. 6.5 mm right upper lobe nodule on image number 54 is also stable. No new pulmonary lesions. No acute overlying pulmonary findings. No pleural effusion. Upper Abdomen: Stable large left renal cyst. Stable hepatic cysts. No adrenal gland lesions. Stable aortic calcifications. Musculoskeletal: No significant bony findings. IMPRESSION: 1. Stable irregular right upper lobe lesion and smaller right upper lobe pulmonary nodule. 2. No mediastinal or hilar mass or adenopathy. 3. Stable underline emphysematous changes and areas of pulmonary scarring. Electronically Signed   By: Rudie MeyerP.  Gallerani M.D.   On: 03/22/2016 09:03   Nm Pet Image Initial (pi) Skull Base To Thigh  01/10/2016  CLINICAL DATA:  Initial treatment strategy for 2 discrete right upper lobe pulmonary nodules. EXAM: NUCLEAR MEDICINE PET SKULL BASE TO THIGH TECHNIQUE: 13.0 mCi F-18 FDG was injected intravenously. Full-ring PET imaging was performed from the skull base to thigh after the radiotracer. CT data was obtained and used for attenuation correction and anatomic localization. FASTING BLOOD GLUCOSE:  Value: 91 mg/dl COMPARISON:  CT  scan 40/98/119105/26/2017 FINDINGS: NECK No hypermetabolic lymph nodes in the neck. CHEST Spiculated posterior right upper lobe nodule is again identified. This lesion is the nodule measured at about 2 cm on the previous diagnostic CT and shows low level FDG accumulation with SUV max = 1.9. The second smaller pulmonary nodule is seen more caudally in the right upper lobe measuring up to about 8 mm. No detectable FDG uptake in this smaller nodule. No evidence for hypermetabolic metastatic disease in the mediastinum or hilar regions. Insert calcium heart Emphysema again noted in the lungs bilaterally. ABDOMEN/PELVIS No abnormal hypermetabolic activity within the liver, pancreas, adrenal glands, or spleen. No hypermetabolic lymph nodes in the abdomen or pelvis. There is abdominal aortic atherosclerosis without aneurysm. Abdominal aorta measures up to 2.8 cm in diameter. 7.0 cm exophytic water density lesion upper pole left kidney is compatible with a cyst in shows no hypermetabolism on PET imaging. The colon does not show diffuse FDG mural uptake on today's study, but there is a single discrete nodular focus of FDG accumulation in the mid to distal sigmoid colon with SUV max = 5.3. This is in a background of diffuse left colonic diverticulosis. SKELETON No focal hypermetabolic activity to suggest skeletal metastasis. IMPRESSION: 1. The larger, more cranial spiculated pulmonary nodule in the right upper lobe shows low level FDG uptake. Well differentiated or low-grade neoplasm remains a concern. 2. 8  mm nodule also identified in the right upper lobe is without evidence of discernible FDG accumulation, but this may well be related to the small size of the nodule. 3. No evidence for hypermetabolic metastatic disease in the chest, abdomen, or pelvis. 4. A single nodular focus of FDG accumulation in the colon is identified in the mid to distal sigmoid segment, in a region of fairly prominent diverticulosis. Given the focality of this  uptake, a discrete area of colonic inflammation or colonic adenoma/carcinoma would be considerations. Correlation with colorectal cancer screening history recommended. 5. Abdominal aortic atherosclerosis. Electronically Signed   By: Kennith Center M.D.   On: 01/10/2016 14:24      Dg Chest 2 View  12/13/2015  CLINICAL DATA:  Followup lung nodule, patient smokes EXAM: CHEST  2 VIEW COMPARISON:  10/12/2015 FINDINGS: Irregular 12 mm right upper lobe nodule persists. Heart size and vascular pattern normal. Left lung is clear. No pleural effusions. IMPRESSION: Persistent irregular 12 mm upper lobe pulmonary nodule on the right. CT thorax preferably with contrast is suggested. These results will be called to the ordering clinician or representative by the Radiologist Assistant, and communication documented in the PACS or zVision Dashboard. Electronically Signed   By: Esperanza Heir M.D.   On: 12/13/2015 14:15   Ct Chest W Contrast  12/16/2015  CLINICAL DATA:  Lung nodule on recent chest x-ray EXAM: CT CHEST WITH CONTRAST TECHNIQUE: Multidetector CT imaging of the chest was performed during intravenous contrast administration. CONTRAST:  75mL ISOVUE-300 IOPAMIDOL (ISOVUE-300) INJECTION 61% COMPARISON:  12/13/2015 FINDINGS: The lungs are well aerated bilaterally. Diffuse emphysematous changes are identified. The left lung shows no evidence of focal infiltrate. The right lung demonstrates an 8 mm nodular density best seen on image number 69 of series 6 as well as a irregular spiculated lesion in the right upper lobe posteriorly which measures at least 2 cm in greatest dimension. Surrounding architectural distortion is noted in these changes are highly suspicious for underlying neoplasm. The thoracic inlet is within normal limits. Aortic calcifications are seen without aneurysmal dilatation or dissection. The pulmonary artery shows no large central pulmonary embolus. Scattered small mediastinal lymph nodes are seen. A  single 10 mm short axis lymph node is noted in the right hilum. The visualized upper abdomen demonstrates cystic changes of the left kidney and liver. A 9 mm short axis portacaval node is noted as well. The osseous structures show no acute abnormality. IMPRESSION: Multiple nodules within the right upper lobe. The largest of these measures at least 2 cm with significant architectural distortion and spiculation. These changes are consistent with pulmonary neoplasm till proven otherwise. Tissue sampling and further workup with a PET-CT may be helpful. These results will be called to the ordering clinician or representative by the Radiologist Assistant, and communication documented in the PACS or zVision Dashboard. Electronically Signed   By: Alcide Clever M.D.   On: 12/16/2015 16:37     I have independently reviewed the above radiology studies  and reviewed the findings with the patient.  Interpretation: The FVC, FEV1, FEV1/FVC ratio and FEF25-75% are reduced indicating airway obstruction. The FVC is reduced relative to the SVC indicating air trapping. . While the vital capacity and total lung capacity are within normal limits, the RV/TLC ratio is increased. Following administration of bronchodilators, there is a significant response indicated by the increased FVC. The reduced diffusing capacity indicates a moderate loss of functional alveolar capillary surface. However, the diffusing capacity was not corrected for  the patient's hemoglobin. Pulmonary Function Diagnosis: Moderate Obstructive Airways Disease with reversibiility Restriction -Probable Moderate Diffusion Defect  FEV1= 2.34 60% DLCO 19.73 58%   Recent Lab Findings: No results found for: WBC, HGB, HCT, PLT, GLUCOSE, CHOL, TRIG, HDL, LDLDIRECT, LDLCALC, ALT, AST, NA, K, CL, CREATININE, BUN, CO2, TSH, INR, GLUF, HGBA1C    Assessment / Plan:   1. Multiple lung nodules right upper lobe and long-term smoker, suggestive of clinical stage IIb  (two separate lung nodules same lobe), assuming no nodal involvement. Pulmonary function studies have been done demonstrated approximately 60% predicted FEV1 and DLCO. The patient has significantly cut down on his smoking. The cranial spiculated pulmonary nodule in the right upper lobe shows low level FDG uptake. Well differentiated or low-grade neoplasm remains a concern, the second 8 mm nodule also identified in the right upper lobe is without evidence of discernible FDG accumulation, but this may well be related to the small size of the nodule.  There is No evidence for hypermetabolic metastatic disease in the chest, abdomen, or pelvis. I discussed in detail with the patient the possibility of low grade malignancy involving the right upper lobe we discussed proceeding  with surgical resection. Because of family concerns job concerns the patient prefered to wait before resection but  agreeded  toa follow-up CT scan in 2 months( 3 months after original CT) which was done today.   Stable irregular right upper lobe lesion and smaller right upper lobe pulmonary nodule.- I reviewed with patient the findings on the follow-up CT scan, the right upper lobe mass has not increased in size appreciably, but remains highly suspicious in a long-term smoker. Again discussed with the patient proceeding directly with resection however he did not wish to do this, he is agreeable to proceeding with ENB with navigation and biopsy of the larger right upper lobe lung lesion. We'll schedule this for September 11  A single nodular focus of FDG accumulation in the colon is identified in the mid to distal sigmoid segment, in a region of fairly prominent diverticulosis. Given the focality of this uptake, a discrete area of colonic inflammation or colonic adenoma/carcinoma would be considerations. Correlation with colorectal cancer screening history recommended. Patient saw  GI  And procedure was done, he reports it was OK.   Moderate  obstructive airway disease with reversibility moderate diffusion deficit- PFTs done 12/27/2015  Delight Ovens MD      [address] [place] [address] Office [phone]   Beeper [phone]  03/22/2016 9:58 AM

## 2016-03-28 NOTE — Pre-Procedure Instructions (Signed)
    [name] Ross  03/28/2016      [id number] [facility] [place], [place] - [address] [place] [id number] Phone: [phone] Fax: [phone]    Your procedure is scheduled on 04/02/16.  [facility] at 530 A.M.  Call this number if you have problems the morning of surgery:  [phone]   Remember:  Do not eat food or drink liquids after midnight.  Take these medicines the morning of surgery with A SIP OF WATER --prilosec   Do not wear jewelry, make-up or nail polish.  Do not wear lotions, powders, or perfumes, or deoderant.  Do not shave 48 hours prior to surgery.  Men may shave face and neck.  Do not bring valuables to the hospital.  [facility] is not responsible for any belongings or valuables.  Contacts, dentures or bridgework may not be worn into surgery.  Leave your suitcase in the car.  After surgery it may be brought to your room.  For patients admitted to the hospital, discharge time will be determined by your treatment team.  Patients discharged the day of surgery will not be allowed to drive home.   Name and phone number of your driver:   Special instructions:  Do not take any aspirin,anti-inflammatories,vitamins,or herbal supplements 5-7 days prior to surgery.  Please read over the following fact sheets that you were given.

## 2016-03-29 ENCOUNTER — Encounter (HOSPITAL_COMMUNITY): Payer: Self-pay

## 2016-03-29 ENCOUNTER — Encounter (HOSPITAL_COMMUNITY)
Admission: RE | Admit: 2016-03-29 | Discharge: 2016-03-29 | Disposition: A | Discharge status: Home / Self Care | Payer: BLUE CROSS/BLUE SHIELD | Source: Ambulatory Visit | Attending: Cardiothoracic Surgery | Admitting: Cardiothoracic Surgery

## 2016-03-29 DIAGNOSIS — I4510 Unspecified right bundle-branch block: Secondary | ICD-10-CM | POA: Diagnosis not present

## 2016-03-29 DIAGNOSIS — R918 Other nonspecific abnormal finding of lung field: Secondary | ICD-10-CM | POA: Diagnosis not present

## 2016-03-29 DIAGNOSIS — I10 Essential (primary) hypertension: Secondary | ICD-10-CM | POA: Diagnosis not present

## 2016-03-29 HISTORY — DX: Chronic obstructive pulmonary disease, unspecified: J44.9

## 2016-03-29 HISTORY — DX: Gastro-esophageal reflux disease without esophagitis: K21.9

## 2016-03-29 LAB — COMPREHENSIVE METABOLIC PANEL
ALT: 20 U/L (ref 17–63)
AST: 20 U/L (ref 15–41)
Albumin: 4.1 g/dL (ref 3.5–5.0)
Alkaline Phosphatase: 79 U/L (ref 38–126)
Anion gap: 10 (ref 5–15)
BUN: 14 mg/dL (ref 6–20)
CO2: 25 mmol/L (ref 22–32)
Calcium: 9.8 mg/dL (ref 8.9–10.3)
Chloride: 105 mmol/L (ref 101–111)
Creatinine, Ser: 0.88 mg/dL (ref 0.61–1.24)
GFR calc Af Amer: >60 mL/min (ref >60)
GFR calc non Af Amer: >60 mL/min (ref >60)
Glucose, Bld: 110 mg/dL — ABNORMAL HIGH (ref 65–99)
Potassium: 4.2 mmol/L (ref 3.5–5.1)
Sodium: 140 mmol/L (ref 135–145)
Total Bilirubin: 0.6 mg/dL (ref 0.3–1.2)
Total Protein: 6.9 g/dL (ref 6.5–8.1)

## 2016-03-29 LAB — CBC
HCT: 44 % (ref 39.0–52.0)
Hemoglobin: 14.8 g/dL (ref 13.0–17.0)
MCH: 31.9 pg (ref 26.0–34.0)
MCHC: 33.6 g/dL (ref 30.0–36.0)
MCV: 94.8 fL (ref 78.0–100.0)
Platelets: 295 10*3/uL (ref 150–400)
RBC: 4.64 MIL/uL (ref 4.22–5.81)
RDW: 12.6 % (ref 11.5–15.5)
WBC: 9.9 10*3/uL (ref 4.0–10.5)

## 2016-03-29 LAB — PROTIME-INR
INR: 0.95
Prothrombin Time: 12.7 seconds (ref 11.4–15.2)

## 2016-03-29 LAB — APTT: aPTT: 33 seconds (ref 24–36)

## 2016-04-01 NOTE — Anesthesia Preprocedure Evaluation (Addendum)
Anesthesia Evaluation  Patient identified by MRN, date of birth, ID band Patient awake    Reviewed: Allergy & Precautions, NPO status , Patient's Chart, lab work & pertinent test results  Airway Mallampati: II  TM Distance: >3 FB Neck ROM: Full    Dental  (+) Dental Advisory Given, Edentulous Upper, Edentulous Lower   Pulmonary COPD, Current Smoker,  Lung mass   Pulmonary exam normal breath sounds clear to auscultation       Cardiovascular Exercise Tolerance: Good negative cardio ROS Normal cardiovascular exam Rhythm:Regular Rate:Normal     Neuro/Psych negative neurological ROS  negative psych ROS   GI/Hepatic Neg liver ROS, GERD  Medicated,  Endo/Other  negative endocrine ROS  Renal/GU negative Renal ROS     Musculoskeletal negative musculoskeletal ROS (+)   Abdominal   Peds  Hematology negative hematology ROS (+)   Anesthesia Other Findings Day of surgery medications reviewed with the patient.  Reproductive/Obstetrics                            Anesthesia Physical Anesthesia Plan  ASA: III  Anesthesia Plan: General   Post-op Pain Management:    Induction: Intravenous  Airway Management Planned: Oral ETT  Additional Equipment:   Intra-op Plan:   Post-operative Plan: Extubation in OR  Informed Consent: I have reviewed the patients History and Physical, chart, labs and discussed the procedure including the risks, benefits and alternatives for the proposed anesthesia with the patient or authorized representative who has indicated his/her understanding and acceptance.   Dental advisory given  Plan Discussed with: CRNA  Anesthesia Plan Comments: (Risks/benefits of general anesthesia discussed with patient including risk of damage to teeth, lips, gum, and tongue, nausea/vomiting, allergic reactions to medications, and the possibility of heart attack, stroke and death.  All  patient questions answered.  Patient wishes to proceed.)        Anesthesia Quick Evaluation

## 2016-04-02 ENCOUNTER — Ambulatory Visit (HOSPITAL_COMMUNITY): Payer: BLUE CROSS/BLUE SHIELD

## 2016-04-02 ENCOUNTER — Ambulatory Visit (HOSPITAL_COMMUNITY)
Admission: RE | Admit: 2016-04-02 | Discharge: 2016-04-02 | Disposition: A | Discharge status: Home / Self Care | Payer: BLUE CROSS/BLUE SHIELD | Source: Ambulatory Visit | Attending: Cardiothoracic Surgery | Admitting: Cardiothoracic Surgery

## 2016-04-02 ENCOUNTER — Ambulatory Visit (HOSPITAL_COMMUNITY): Payer: BLUE CROSS/BLUE SHIELD | Admitting: Certified Registered Nurse Anesthetist

## 2016-04-02 ENCOUNTER — Ambulatory Visit (HOSPITAL_COMMUNITY): Payer: BLUE CROSS/BLUE SHIELD | Admitting: Emergency Medicine

## 2016-04-02 ENCOUNTER — Encounter (HOSPITAL_COMMUNITY)
Admission: RE | Disposition: A | Discharge status: Home / Self Care | Payer: Self-pay | Source: Ambulatory Visit | Attending: Cardiothoracic Surgery

## 2016-04-02 ENCOUNTER — Encounter (HOSPITAL_COMMUNITY): Payer: Self-pay | Admitting: *Deleted

## 2016-04-02 DIAGNOSIS — R911 Solitary pulmonary nodule: Secondary | ICD-10-CM | POA: Insufficient documentation

## 2016-04-02 DIAGNOSIS — Z09 Encounter for follow-up examination after completed treatment for conditions other than malignant neoplasm: Secondary | ICD-10-CM

## 2016-04-02 DIAGNOSIS — K5790 Diverticulosis of intestine, part unspecified, without perforation or abscess without bleeding: Secondary | ICD-10-CM | POA: Insufficient documentation

## 2016-04-02 DIAGNOSIS — K219 Gastro-esophageal reflux disease without esophagitis: Secondary | ICD-10-CM | POA: Diagnosis not present

## 2016-04-02 DIAGNOSIS — R918 Other nonspecific abnormal finding of lung field: Secondary | ICD-10-CM

## 2016-04-02 DIAGNOSIS — J449 Chronic obstructive pulmonary disease, unspecified: Secondary | ICD-10-CM | POA: Diagnosis not present

## 2016-04-02 DIAGNOSIS — F17210 Nicotine dependence, cigarettes, uncomplicated: Secondary | ICD-10-CM | POA: Diagnosis not present

## 2016-04-02 DIAGNOSIS — M549 Dorsalgia, unspecified: Secondary | ICD-10-CM | POA: Diagnosis not present

## 2016-04-02 DIAGNOSIS — Z419 Encounter for procedure for purposes other than remedying health state, unspecified: Secondary | ICD-10-CM

## 2016-04-02 HISTORY — PX: VIDEO BRONCHOSCOPY WITH ENDOBRONCHIAL NAVIGATION: SHX6175

## 2016-04-02 SURGERY — VIDEO BRONCHOSCOPY WITH ENDOBRONCHIAL NAVIGATION
Anesthesia: General | Site: Chest

## 2016-04-02 MED ORDER — ONDANSETRON HCL 4 MG/2ML IJ SOLN
INTRAMUSCULAR | Status: DC | PRN
  Administered 2016-04-02: 4 mg via INTRAVENOUS

## 2016-04-02 MED ORDER — MIDAZOLAM HCL 2 MG/2ML IJ SOLN
INTRAMUSCULAR | Status: AC
  Filled 2016-04-02: qty 2

## 2016-04-02 MED ORDER — EPHEDRINE SULFATE-NACL 50-0.9 MG/10ML-% IV SOSY
PREFILLED_SYRINGE | INTRAVENOUS | Status: DC | PRN
  Administered 2016-04-02 (×2): 5 mg via INTRAVENOUS

## 2016-04-02 MED ORDER — SUGAMMADEX SODIUM 200 MG/2ML IV SOLN
INTRAVENOUS | Status: DC | PRN
Start: 2016-04-02 — End: 2016-04-02
  Administered 2016-04-02: 200 mg via INTRAVENOUS

## 2016-04-02 MED ORDER — FENTANYL CITRATE (PF) 100 MCG/2ML IJ SOLN: 25.0000 ug | INTRAMUSCULAR | Status: DC | PRN

## 2016-04-02 MED ORDER — PROPOFOL 10 MG/ML IV BOLUS
INTRAVENOUS | Status: AC
  Filled 2016-04-02: qty 40

## 2016-04-02 MED ORDER — PROMETHAZINE HCL 25 MG/ML IJ SOLN: 6.2500 mg | INTRAMUSCULAR | Status: DC | PRN

## 2016-04-02 MED ORDER — PROPOFOL 10 MG/ML IV BOLUS
INTRAVENOUS | Status: DC | PRN
  Administered 2016-04-02: 150 mg via INTRAVENOUS
  Administered 2016-04-02: 30 mg via INTRAVENOUS

## 2016-04-02 MED ORDER — LACTATED RINGERS IV SOLN
INTRAVENOUS | Status: DC | PRN
  Administered 2016-04-02 (×2): via INTRAVENOUS

## 2016-04-02 MED ORDER — LIDOCAINE 2% (20 MG/ML) 5 ML SYRINGE
INTRAMUSCULAR | Status: DC | PRN
  Administered 2016-04-02: 100 mg via INTRAVENOUS

## 2016-04-02 MED ORDER — FENTANYL CITRATE (PF) 100 MCG/2ML IJ SOLN
INTRAMUSCULAR | Status: DC | PRN
  Administered 2016-04-02 (×4): 50 ug via INTRAVENOUS

## 2016-04-02 MED ORDER — FENTANYL CITRATE (PF) 100 MCG/2ML IJ SOLN
INTRAMUSCULAR | Status: AC
  Filled 2016-04-02: qty 4

## 2016-04-02 MED ORDER — 0.9 % SODIUM CHLORIDE (POUR BTL) OPTIME
TOPICAL | Status: DC | PRN
  Administered 2016-04-02: 1000 mL

## 2016-04-02 MED ORDER — EPINEPHRINE HCL 1 MG/ML IJ SOLN: INTRAMUSCULAR | Status: DC

## 2016-04-02 MED ORDER — MIDAZOLAM HCL 5 MG/5ML IJ SOLN
INTRAMUSCULAR | Status: DC | PRN
  Administered 2016-04-02: 2 mg via INTRAVENOUS

## 2016-04-02 MED ORDER — DEXTROSE 5 % IV SOLN
INTRAVENOUS | Status: DC | PRN
  Administered 2016-04-02: 25 ug/min via INTRAVENOUS

## 2016-04-02 MED ORDER — ROCURONIUM BROMIDE 10 MG/ML (PF) SYRINGE
PREFILLED_SYRINGE | INTRAVENOUS | Status: DC | PRN
  Administered 2016-04-02: 5 mg via INTRAVENOUS
  Administered 2016-04-02 (×2): 10 mg via INTRAVENOUS
  Administered 2016-04-02: 15 mg via INTRAVENOUS
  Administered 2016-04-02: 50 mg via INTRAVENOUS

## 2016-04-02 SURGICAL SUPPLY — 39 items
ADAPTER BRONCH F/PENTAX (ADAPTER) ×3 IMPLANT
BRUSH BIOPSY BRONCH 10 SDTNB (MISCELLANEOUS) ×2 IMPLANT
BRUSH BIOPSY BRONCH 10MM SDTNB (MISCELLANEOUS) ×1
BRUSH SUPERTRAX BIOPSY (INSTRUMENTS) IMPLANT
BRUSH SUPERTRAX NDL-TIP CYTO (INSTRUMENTS) ×3 IMPLANT
CANISTER SUCTION 2500CC (MISCELLANEOUS) ×3 IMPLANT
CHANNEL WORK EXTEND EDGE 180 (KITS) IMPLANT
CHANNEL WORK EXTEND EDGE 45 (KITS) IMPLANT
CHANNEL WORK EXTEND EDGE 90 (KITS) IMPLANT
CONT SPEC 4OZ CLIKSEAL STRL BL (MISCELLANEOUS) ×6 IMPLANT
COVER TABLE BACK 60X90 (DRAPES) ×3 IMPLANT
DRSG AQUACEL AG ADV 3.5X14 (GAUZE/BANDAGES/DRESSINGS) ×3 IMPLANT
FILTER STRAW FLUID ASPIR (MISCELLANEOUS) IMPLANT
FORCEPS BIOP SUPERTRX PREMAR (INSTRUMENTS) ×3 IMPLANT
GAUZE SPONGE 4X4 12PLY STRL (GAUZE/BANDAGES/DRESSINGS) ×3 IMPLANT
GLOVE BIO SURGEON STRL SZ 6.5 (GLOVE) ×2 IMPLANT
GLOVE BIO SURGEONS STRL SZ 6.5 (GLOVE) ×1
GLOVE BIOGEL PI IND STRL 6.5 (GLOVE) ×1 IMPLANT
GLOVE BIOGEL PI INDICATOR 6.5 (GLOVE) ×2
GLOVE SURG SS PI 6.5 STRL IVOR (GLOVE) ×3 IMPLANT
KIT CLEAN ENDO COMPLIANCE (KITS) ×3 IMPLANT
KIT PROCEDURE EDGE 180 (KITS) ×3 IMPLANT
KIT PROCEDURE EDGE 45 (KITS) IMPLANT
KIT PROCEDURE EDGE 90 (KITS) IMPLANT
KIT ROOM TURNOVER OR (KITS) ×3 IMPLANT
MARKER SKIN DUAL TIP RULER LAB (MISCELLANEOUS) ×3 IMPLANT
NEEDLE SUPERTRX PREMARK BIOPSY (NEEDLE) ×3 IMPLANT
NS IRRIG 1000ML POUR BTL (IV SOLUTION) ×3 IMPLANT
OIL SILICONE PENTAX (PARTS (SERVICE/REPAIRS)) ×3 IMPLANT
PAD ARMBOARD 7.5X6 YLW CONV (MISCELLANEOUS) ×6 IMPLANT
PATCHES PATIENT (LABEL) ×9 IMPLANT
SYR 20CC LL (SYRINGE) IMPLANT
SYR 20ML ECCENTRIC (SYRINGE) ×3 IMPLANT
TOWEL OR 17X24 6PK STRL BLUE (TOWEL DISPOSABLE) ×3 IMPLANT
TRAP SPECIMEN MUCOUS 40CC (MISCELLANEOUS) ×3 IMPLANT
TUBE CONNECTING 20'X1/4 (TUBING) ×1
TUBE CONNECTING 20X1/4 (TUBING) ×2 IMPLANT
UNDERPAD 30X30 (UNDERPADS AND DIAPERS) ×3 IMPLANT
WATER STERILE IRR 1000ML POUR (IV SOLUTION) ×3 IMPLANT

## 2016-04-02 NOTE — Brief Op Note (Addendum)
      [address]       [place] [id number]             [phone]     04/02/2016  9:23 AM  PATIENT:  [name] Ross  55 y.o. male  PRE-OPERATIVE DIAGNOSIS:  LUNG MASS  POST-OPERATIVE DIAGNOSIS:  LUNG MASS, final path pending question of granuloma ,   PROCEDURE:  Procedure(s): VIDEO BRONCHOSCOPY WITH ENDOBRONCHIAL NAVIGATION (N/A)  SURGEON:  Surgeon(s) and Role:    * Delight OvensEdward B Yobany Vroom, MD - Primary   ANESTHESIA:   general  EBL:  Total I/O In: 1000 [I.V.:1000] Out: 2 [Blood:2]  BLOOD ADMINISTERED:none  DRAINS: none   LOCAL MEDICATIONS USED:  NONE  SPECIMEN:  Source of Specimen:  right upper lobe   DISPOSITION OF SPECIMEN:  PATHOLOGY  COUNTS:  YES   DICTATION: .Dragon Dictation  PLAN OF CARE: Discharge to home after PACU  PATIENT DISPOSITION:  PACU - hemodynamically stable.   Delay start of Pharmacological VTE agent (>24hrs) due to surgical blood loss or risk of bleeding: yes  Discussed with Pathologist, some of bx specimen to be sent for culture and bacterial smear

## 2016-04-02 NOTE — Transfer of Care (Signed)
Immediate Anesthesia Transfer of Care Note  Patient: [name] Ross  Procedure(s) Performed: Procedure(s): VIDEO BRONCHOSCOPY WITH ENDOBRONCHIAL NAVIGATION (N/A)  Patient Location: PACU  Anesthesia Type:General  Level of Consciousness: awake, alert , oriented and patient cooperative  Airway & Oxygen Therapy: Patient Spontanous Breathing and Patient connected to face mask oxygen  Post-op Assessment: Report given to RN and Post -op Vital signs reviewed and stable  Post vital signs: Reviewed and stable  Last Vitals:  Vitals:   04/02/16 0550 04/02/16 0924  BP: (!) 169/76   Pulse: 70   Resp: 20   Temp: 37.2 C (P) 36.6 C    Last Pain:  Vitals:   04/02/16 0550  TempSrc: Oral         Complications: No apparent anesthesia complications

## 2016-04-02 NOTE — Discharge Instructions (Signed)
Flexible Bronchoscopy, Care After °Refer to this sheet in the next few weeks. These instructions provide you with information on caring for yourself after your procedure. Your health care provider may also give you more specific instructions. Your treatment has been planned according to current medical practices, but problems sometimes occur. Call your health care provider if you have any problems or questions after your procedure.  °WHAT TO EXPECT AFTER THE PROCEDURE °It is normal to have the following symptoms for 24-48 hours after the procedure:  °· Increased cough. °· Low-grade fever. °· Sore throat or hoarse voice. °· Small streaks of blood in your thick spit (sputum) if tissue samples were taken (biopsy). °HOME CARE INSTRUCTIONS  °· Do not eat or drink anything for 2 hours after your procedure. Your nose and throat were numbed by medicine. If you try to eat or drink before the medicine wears off, food or drink could go into your lungs or you could burn yourself. After the numbness is gone and your cough and gag reflexes have returned, you may eat soft food and drink liquids slowly.   °· The day after the procedure, you can go back to your normal diet.   °· You may resume normal activities.   °· Keep all follow-up visits as directed by your health care provider. It is important to keep all your appointments, especially if tissue samples were taken for testing (biopsy). °SEEK IMMEDIATE MEDICAL CARE IF:  °· You have increasing shortness of breath.   °· You become light-headed or faint.   °· You have chest pain.   °· You have any new concerning symptoms. °· You cough up more than a small amount of blood. °· The amount of blood you cough up increases. °MAKE SURE YOU: °· Understand these instructions. °· Will watch your condition. °· Will get help right away if you are not doing well or get worse. °  °This information is not intended to replace advice given to you by your health care provider. Make sure you discuss  any questions you have with your health care provider. °  °Document Released: 01/26/2005 Document Revised: 07/30/2014 Document Reviewed: 03/13/2013 °Elsevier Interactive Patient Education ©2016 [employer] ° °

## 2016-04-02 NOTE — H&P (Signed)
[address]       [place] [address]             [phone]                    KEADEN GUNNOE [facility] Medical Record #[id number] Date of Birth: April 24, 1961  Referring: No ref. provider found Primary Care: Maryelizabeth Rowan, MD  Chief Complaint:    Right lung nodule    History of Present Illness:    [name] Ross 55 y.o. male is seen in the office two months ago  for anormal chest x-ray and CT scan. The patient had a chest x-ray in March of this year demonstrating a right upper lobe lung nodule. He was seen in urgent care because of cough and pulmonary complaints. Follow-up chest x-ray done in the primary care office in late May showed persistent of right upper lobe lung lesion, this is confirmed on CT scan. The patient is a long-term at least a pack a day for more than 30 years. Since previously seen he has significantly cut down on his smoking but not stopped.  He does have environmental/work exposure to dust but is not aware of any specific asbestos exposure. He denies any previous cardiac history, denies tuberculosis exposure.  Although resection was recommended to the patient 2 months ago he wished to wait and have a follow-up scan   Current Activity/ Functional Status:  Patient is independent with mobility/ambulation, transfers, ADL's, IADL's.   Zubrod Score: At the time of surgery this patient's most appropriate activity status/level should be described as: [x]     0    Normal activity, no symptoms []     1    Restricted in physical strenuous activity but ambulatory, able to do out light work []     2    Ambulatory and capable of self care, unable to do work activities, up and about               >50 % of waking hours                              []     3    Only limited self care, in bed greater than 50% of waking hours []     4    Completely disabled, no self care, confined to bed or chair []     5    Moribund   Past Medical History:    Diagnosis Date  . Back pain   . Chronic bronchitis (HCC)   . COPD (chronic obstructive pulmonary disease) (HCC)   . GERD (gastroesophageal reflux disease)   . Multiple lung nodules   . Tobacco abuse     Past Surgical History:  Procedure Laterality Date  . APPENDECTOMY    . BACK SURGERY      History reviewed. No pertinent family history.  Social History   Social History  . Marital status: Single    Spouse name: N/A  . Number of children: N/A  . Years of education: N/A   Occupational History  . Not on file.   Social History Main Topics  . Smoking status: Current Every Day Smoker    Packs/day: 1.00    Years: 20.00  . Smokeless tobacco: Never Used  . Alcohol use Yes     Comment: daily  . Drug use: No  . Sexual activity: Not on file  Other Topics Concern  . Not on file   Social History Narrative  . No narrative on file    History  Smoking Status  . Current Every Day Smoker  . Packs/day: 1.00  . Years: 20.00  Smokeless Tobacco  . Never Used    History  Alcohol Use  . Yes    Comment: daily     Allergies  Allergen Reactions  . Codeine Hives  . Demerol [Meperidine] Hives    No current facility-administered medications for this encounter.    Facility-Administered Medications Ordered in Other Encounters  Medication Dose Route Frequency Provider Last Rate Last Dose  . lactated ringers infusion    Continuous PRN Waynard Edwards, CRNA          Review of Systems:     Cardiac Review of Systems: Y or N  Chest Pain [  n  ]  Resting SOB [ n  ] Exertional SOB  [ n ]  Orthopnea [ n ]   Pedal Edema [n   ]    Palpitations [n  ] Syncope  [ n ]   Presyncope [   n]  General Review of Systems: [Y] = yes [  ]=no Constitional: recent weight change [  n];  Wt loss over the last 3 months [   ] anorexia [  ]; fatigue [  ]; nausea [  ]; night sweats [  ]; fever [  ]; or chills [  ];          Dental: poor dentition[  ]; Last Dentist visit: dentures  Eye : blurred  vision [  ]; diplopia [   ]; vision changes [  ];  Amaurosis fugax[  ]; Resp: cough [  ];  wheezing[  ];  hemoptysis[  ]; shortness of breath[  ]; paroxysmal nocturnal dyspnea[  ]; dyspnea on exertion[  ]; or orthopnea[  ];  GI:  gallstones[  ], vomiting[  ];  dysphagia[  ]; melena[  ];  hematochezia [  ]; heartburn[  ];   Hx of  Colonoscopy[  ]; GU: kidney stones [  ]; hematuria[  ];   dysuria [  ];  nocturia[  ];  history of     obstruction [  ]; urinary frequency [  ]             Skin: rash, swelling[  ];, hair loss[  ];  peripheral edema[  ];  or itching[  ]; Musculosketetal: myalgias[  ];  joint swelling[  ];  joint erythema[  ];  joint pain[  ];  back pain[  ];  Heme/Lymph: bruising[  ];  bleeding[  ];  anemia[  ];  Neuro: TIA[  ];  headaches[  ];  stroke[  ];  vertigo[  ];  seizures[  ];   paresthesias[  ];  difficulty walking[n  ];  Psych:depression[  ]; anxiety[  ];  Endocrine: diabetes[ n ];  thyroid dysfunction[n  ];  Immunizations: Flu up to date [?  ]; Pneumococcal up to date [ ? ];  Other:  Physical Exam: BP (!) 169/76   Pulse 70   Temp 98.9 F (37.2 C) (Oral)   Resp 20   Ht 5\' 11"  (1.803 m)   Wt 150 lb (68 kg)   SpO2 96%   BMI 20.92 kg/m   PHYSICAL EXAMINATION: General appearance: alert, cooperative, appears stated age and no distress Head: Normocephalic, without obvious abnormality, atraumatic Neck: no adenopathy, no carotid bruit,  no JVD, supple, symmetrical, trachea midline and thyroid not enlarged, symmetric, no tenderness/mass/nodules Lymph nodes: Cervical, supraclavicular, and axillary nodes normal. Resp: clear to auscultation bilaterally Back: symmetric, no curvature. ROM normal. No CVA tenderness. Cardio: regular rate and rhythm, S1, S2 normal, no murmur, click, rub or gallop GI: soft, non-tender; bowel sounds normal; no masses,  no organomegaly Extremities: extremities normal, atraumatic, no cyanosis or edema and Homans sign is negative, no sign of  DVT Neurologic: Grossly normal  Diagnostic Studies & Laboratory data:     Recent Radiology Findings: Ct Super D Chest Wo Contrast  Result Date: 03/22/2016 CLINICAL DATA:  Preop for right lung biopsy. EXAM: CT CHEST WITHOUT CONTRAST TECHNIQUE: Multidetector CT imaging of the chest was performed using thin slice collimation for electromagnetic bronchoscopy planning purposes, without intravenous contrast. COMPARISON:  Chest CT 12/16/2015 and PET-CT 01/10/2016 FINDINGS: Chest wall: No chest wall mass, supraclavicular or axillary lymphadenopathy. The thyroid gland is normal. Cardiovascular: The heart is normal in size. No pericardial effusion. Stable tortuosity and calcification of the thoracic aorta. Stable coronary artery calcifications. Mediastinum/Nodes: No new mediastinal or hilar mass or adenopathy. Small scattered lymph nodes are stable. These were not metabolically active on the PET-CT. The esophagus is grossly normal. Lungs/Pleura: Stable advanced emphysematous changes and areas of pulmonary scarring. The irregular right upper lobe density is stable. It measures a maximum of 31.5 x 24 mm on the sagittal sequence. 6.5 mm right upper lobe nodule on image number 54 is also stable. No new pulmonary lesions. No acute overlying pulmonary findings. No pleural effusion. Upper Abdomen: Stable large left renal cyst. Stable hepatic cysts. No adrenal gland lesions. Stable aortic calcifications. Musculoskeletal: No significant bony findings. IMPRESSION: 1. Stable irregular right upper lobe lesion and smaller right upper lobe pulmonary nodule. 2. No mediastinal or hilar mass or adenopathy. 3. Stable underline emphysematous changes and areas of pulmonary scarring. Electronically Signed   By: Rudie Meyer M.D.   On: 03/22/2016 09:03   Nm Pet Image Initial (pi) Skull Base To Thigh  01/10/2016  CLINICAL DATA:  Initial treatment strategy for 2 discrete right upper lobe pulmonary nodules. EXAM: NUCLEAR MEDICINE PET  SKULL BASE TO THIGH TECHNIQUE: 13.0 mCi F-18 FDG was injected intravenously. Full-ring PET imaging was performed from the skull base to thigh after the radiotracer. CT data was obtained and used for attenuation correction and anatomic localization. FASTING BLOOD GLUCOSE:  Value: 91 mg/dl COMPARISON:  CT scan 40/98/1191 FINDINGS: NECK No hypermetabolic lymph nodes in the neck. CHEST Spiculated posterior right upper lobe nodule is again identified. This lesion is the nodule measured at about 2 cm on the previous diagnostic CT and shows low level FDG accumulation with SUV max = 1.9. The second smaller pulmonary nodule is seen more caudally in the right upper lobe measuring up to about 8 mm. No detectable FDG uptake in this smaller nodule. No evidence for hypermetabolic metastatic disease in the mediastinum or hilar regions. Insert calcium heart Emphysema again noted in the lungs bilaterally. ABDOMEN/PELVIS No abnormal hypermetabolic activity within the liver, pancreas, adrenal glands, or spleen. No hypermetabolic lymph nodes in the abdomen or pelvis. There is abdominal aortic atherosclerosis without aneurysm. Abdominal aorta measures up to 2.8 cm in diameter. 7.0 cm exophytic water density lesion upper pole left kidney is compatible with a cyst in shows no hypermetabolism on PET imaging. The colon does not show diffuse FDG mural uptake on today's study, but there is a single discrete nodular focus of FDG accumulation in the  mid to distal sigmoid colon with SUV max = 5.3. This is in a background of diffuse left colonic diverticulosis. SKELETON No focal hypermetabolic activity to suggest skeletal metastasis. IMPRESSION: 1. The larger, more cranial spiculated pulmonary nodule in the right upper lobe shows low level FDG uptake. Well differentiated or low-grade neoplasm remains a concern. 2. 8 mm nodule also identified in the right upper lobe is without evidence of discernible FDG accumulation, but this may well be related  to the small size of the nodule. 3. No evidence for hypermetabolic metastatic disease in the chest, abdomen, or pelvis. 4. A single nodular focus of FDG accumulation in the colon is identified in the mid to distal sigmoid segment, in a region of fairly prominent diverticulosis. Given the focality of this uptake, a discrete area of colonic inflammation or colonic adenoma/carcinoma would be considerations. Correlation with colorectal cancer screening history recommended. 5. Abdominal aortic atherosclerosis. Electronically Signed   By: Kennith Center M.D.   On: 01/10/2016 14:24      Dg Chest 2 View  12/13/2015  CLINICAL DATA:  Followup lung nodule, patient smokes EXAM: CHEST  2 VIEW COMPARISON:  10/12/2015 FINDINGS: Irregular 12 mm right upper lobe nodule persists. Heart size and vascular pattern normal. Left lung is clear. No pleural effusions. IMPRESSION: Persistent irregular 12 mm upper lobe pulmonary nodule on the right. CT thorax preferably with contrast is suggested. These results will be called to the ordering clinician or representative by the Radiologist Assistant, and communication documented in the PACS or zVision Dashboard. Electronically Signed   By: Esperanza Heir M.D.   On: 12/13/2015 14:15   Ct Chest W Contrast  12/16/2015  CLINICAL DATA:  Lung nodule on recent chest x-ray EXAM: CT CHEST WITH CONTRAST TECHNIQUE: Multidetector CT imaging of the chest was performed during intravenous contrast administration. CONTRAST:  75mL ISOVUE-300 IOPAMIDOL (ISOVUE-300) INJECTION 61% COMPARISON:  12/13/2015 FINDINGS: The lungs are well aerated bilaterally. Diffuse emphysematous changes are identified. The left lung shows no evidence of focal infiltrate. The right lung demonstrates an 8 mm nodular density best seen on image number 69 of series 6 as well as a irregular spiculated lesion in the right upper lobe posteriorly which measures at least 2 cm in greatest dimension. Surrounding architectural distortion is  noted in these changes are highly suspicious for underlying neoplasm. The thoracic inlet is within normal limits. Aortic calcifications are seen without aneurysmal dilatation or dissection. The pulmonary artery shows no large central pulmonary embolus. Scattered small mediastinal lymph nodes are seen. A single 10 mm short axis lymph node is noted in the right hilum. The visualized upper abdomen demonstrates cystic changes of the left kidney and liver. A 9 mm short axis portacaval node is noted as well. The osseous structures show no acute abnormality. IMPRESSION: Multiple nodules within the right upper lobe. The largest of these measures at least 2 cm with significant architectural distortion and spiculation. These changes are consistent with pulmonary neoplasm till proven otherwise. Tissue sampling and further workup with a PET-CT may be helpful. These results will be called to the ordering clinician or representative by the Radiologist Assistant, and communication documented in the PACS or zVision Dashboard. Electronically Signed   By: Alcide Clever M.D.   On: 12/16/2015 16:37     I have independently reviewed the above radiology studies  and reviewed the findings with the patient.  Interpretation: The FVC, FEV1, FEV1/FVC ratio and FEF25-75% are reduced indicating airway obstruction. The FVC is reduced relative to  the SVC indicating air trapping. . While the vital capacity and total lung capacity are within normal limits, the RV/TLC ratio is increased. Following administration of bronchodilators, there is a significant response indicated by the increased FVC. The reduced diffusing capacity indicates a moderate loss of functional alveolar capillary surface. However, the diffusing capacity was not corrected for the patient's hemoglobin. Pulmonary Function Diagnosis: Moderate Obstructive Airways Disease with reversibiility Restriction -Probable Moderate Diffusion Defect  FEV1= 2.34 60% DLCO 19.73  58%   Recent Lab Findings: Lab Results  Component Value Date   WBC 9.9 03/29/2016   HGB 14.8 03/29/2016   HCT 44.0 03/29/2016   PLT 295 03/29/2016   GLUCOSE 110 (H) 03/29/2016   ALT 20 03/29/2016   AST 20 03/29/2016   NA 140 03/29/2016   K 4.2 03/29/2016   CL 105 03/29/2016   CREATININE 0.88 03/29/2016   BUN 14 03/29/2016   CO2 25 03/29/2016   INR 0.95 03/29/2016      Assessment / Plan:   1. Multiple lung nodules right upper lobe and long-term smoker, suggestive of clinical stage IIb (two separate lung nodules same lobe), assuming no nodal involvement. Pulmonary function studies have been done demonstrated approximately 60% predicted FEV1 and DLCO. The patient has significantly cut down on his smoking. The cranial spiculated pulmonary nodule in the right upper lobe shows low level FDG uptake. Well differentiated or low-grade neoplasm remains a concern, the second 8 mm nodule also identified in the right upper lobe is without evidence of discernible FDG accumulation, but this may well be related to the small size of the nodule.  There is No evidence for hypermetabolic metastatic disease in the chest, abdomen, or pelvis. I discussed in detail with the patient the possibility of low grade malignancy involving the right upper lobe we discussed proceeding  with surgical resection. Because of family concerns job concerns the patient prefered to wait before resection but  agreeded  To a follow-up CT scan in 2 months( 3 months after original CT) which was done 03/22/2016.   Stable irregular right upper lobe lesion and smaller right upper lobe pulmonary nodule.- I reviewed with patient the findings on the follow-up CT scan, the right upper lobe mass has not increased in size appreciably, but remains highly suspicious in a long-term smoker. Again discussed with the patient proceeding directly with resection however he did not wish to do this, he is agreeable to proceeding with ENB with navigation and  biopsy of the larger right upper lobe lung lesion. We'll schedule this for September 11  A single nodular focus of FDG accumulation in the colon is identified in the mid to distal sigmoid segment, in a region of fairly prominent diverticulosis. Given the focality of this uptake, a discrete area of colonic inflammation or colonic adenoma/carcinoma would be considerations. Correlation with colorectal cancer screening history recommended. Patient saw  GI  And procedure was done, he reports it was OK.   Moderate obstructive airway disease with reversibility moderate diffusion deficit- PFTs done 12/27/2015   The goals risks and alternatives of the planned surgical procedure Navigation Bronchoscopy   have been discussed with the patient in detail. The risks of the procedure including death, infection, stroke, myocardial infarction, bleeding, blood transfusion have all been discussed specifically.  I have quoted Irwin Brakeman a 1 % of perioperative mortality and a complication rate as high as 10 %. The patient's questions have been answered.[name] Ross is willing  to proceed with the planned  procedure.  Delight OvensEdward B Tanishia Lemaster MD      [address] [place] [id number] Office [phone]   Beeper [phone]  04/02/2016 7:10 AM

## 2016-04-02 NOTE — Anesthesia Procedure Notes (Signed)
Procedure Name: Intubation Date/Time: 04/02/2016 7:34 AM Performed by: Annabelle HarmanSMITH, Anakaren Campion A Pre-anesthesia Checklist: Patient identified, Emergency Drugs available, Suction available and Patient being monitored Patient Re-evaluated:Patient Re-evaluated prior to inductionOxygen Delivery Method: Circle system utilized Preoxygenation: Pre-oxygenation with 100% oxygen Intubation Type: IV induction Ventilation: Mask ventilation without difficulty and Oral airway inserted - appropriate to patient size Laryngoscope Size: 2 and Miller Grade View: Grade I Tube type: Oral Tube size: 8.5 mm Number of attempts: 1 Airway Equipment and Method: Stylet Placement Confirmation: ETT inserted through vocal cords under direct vision,  positive ETCO2 and breath sounds checked- equal and bilateral Secured at: 23 cm Tube secured with: Tape Dental Injury: Teeth and Oropharynx as per pre-operative assessment

## 2016-04-03 ENCOUNTER — Encounter (HOSPITAL_COMMUNITY): Payer: Self-pay | Admitting: Cardiothoracic Surgery

## 2016-04-03 LAB — ACID FAST SMEAR (AFB, MYCOBACTERIA): Acid Fast Smear: NEGATIVE

## 2016-04-03 NOTE — Op Note (Signed)
NAME:  [name] Ross Ross, [name] Ross              ACCOUNT NO.:  [id number]  MEDICAL RECORD NO.:  [id number]  LOCATION:  [facility]                         FACILITY:  [facility]  PHYSICIAN:  Sheliah PlaneEdward Jashua Knaak, MD    DATE OF BIRTH:  1960-12-20  DATE OF PROCEDURE:  04/02/2016 DATE OF DISCHARGE:  04/02/2016                              OPERATIVE REPORT   PREOPERATIVE DIAGNOSIS:  Right upper lobe lung nodule.  POSTOPERATIVE DIAGNOSIS:  Right upper lobe lung nodule, final path pending.  SURGICAL PROCEDURE:  Bronchoscopy with navigation bronchoscopy and biopsy of right upper lobe lung nodule.  SURGEON:  Sheliah PlaneEdward Abdulmalik Darco, M.D.  BRIEF HISTORY:  The patient is a 55 year old male with a long history of smoking and current smoker, who presented with incidental finding on x- ray of a right upper lobe lung nodule.  This was first noted in the spring of 2017.  The patient was referred 3 months ago to Thoracic Surgery and it was recommended at that time to consider surgical resection or at least biopsy.  A PET scan showed low level of metabolic activity and the nodule.  In addition, there was another less than 8 mm nodule also in the right upper lobe.  There was no evidence of mediastinal adenopathy.  At that time, the patient declined any interventional treatment, but was agreeable to follow up CT scan.  A Super D CT scan was performed that showed little change in the nodule, but it was still present and again the patient at this time was agreeable to proceeding with biopsy.  Risks and options of bronchoscopy with navigation were discussed with the patient who agreed and signed informed consent.  DESCRIPTION OF PROCEDURE:  The patient underwent general endotracheal anesthesia without incident.  Appropriate time-out was then performed and we proceeded with standard bronchoscopy with a 2.8 mm video bronchoscope.  The tracheobronchial tree was examined up to the subsegmental level bilaterally without evidence of  endobronchial lesions.  We then preoperatively had used a Super D CT images to create a plan for navigation bronchoscopy to the larger right upper lobe lung lesion.  With the appropriate sensors in place, the plan was registered through the Super D software.  A working channel and LG probe were passed up through the bronchoscope and registration of the plan was performed.  We then manipulated our bronchoscope and working channel into the right upper lobe bronchus and passed the working channel with the LG probe to the target.  This resulted in a very nice approach to the target with correct alignment and good distance from the target.  We then proceeded in a serial fashion with needle brush with multiple passes with a triple brush and then used a small biopsy forceps. Between each device, the LG probe was placed back through the channel to confirm our location.  This was also done under fluoroscopic guidance. The initial smears showed bronchial cells without malignancy. Additional passes with the triple brush were performed.  These quick stain smears showed no malignancy, suggested granuloma.  Discussion with the pathologist.  Additional cultures of the remaining material in addition to histology will be performed and smears for AFB and fungus. The remaining aspirations and triple brush  were placed in satellite for further study.  The patient tolerated the procedure without complication.  The working channels and scope were removed.  There was minimal bleeding.  The patient was extubated in the operating room and transferred to the recovery room in good condition for further postoperative observation.  At the completion of the case, fluoroscopy was performed with no evidence of pneumothorax.     Sheliah Plane, MD     EG/MEDQ  D:  04/03/2016  T:  04/03/2016  Job:  [id number]

## 2016-04-03 NOTE — Anesthesia Postprocedure Evaluation (Signed)
Anesthesia Post Note  Patient: Irwin BrakemanRoger D Jablonski  Procedure(s) Performed: Procedure(s) (LRB): VIDEO BRONCHOSCOPY WITH ENDOBRONCHIAL NAVIGATION (N/A)  Patient location during evaluation: PACU Anesthesia Type: General Level of consciousness: awake and alert Pain management: pain level controlled Vital Signs Assessment: post-procedure vital signs reviewed and stable Respiratory status: spontaneous breathing, nonlabored ventilation, respiratory function stable and patient connected to nasal cannula oxygen Cardiovascular status: blood pressure returned to baseline and stable Postop Assessment: no signs of nausea or vomiting Anesthetic complications: no    Last Vitals:  Vitals:   04/02/16 1030 04/02/16 1059  BP: 132/84 (!) 143/72  Pulse: 79   Resp: 20   Temp:  36.1 C    Last Pain:  Vitals:   04/02/16 0550  TempSrc: Oral                 Cecile HearingStephen Edward Turk

## 2016-04-04 LAB — QUANTIFERON IN TUBE
QFT TB AG MINUS NIL VALUE: 0.04 IU/mL
QUANTIFERON MITOGEN VALUE: >10 IU/mL
QUANTIFERON TB AG VALUE: 0.09 IU/mL
QUANTIFERON TB GOLD: NEGATIVE
Quantiferon Nil Value: 0.05 IU/mL

## 2016-04-04 LAB — QUANTIFERON TB GOLD ASSAY (BLOOD)

## 2016-04-05 ENCOUNTER — Encounter: Payer: Self-pay | Admitting: Cardiothoracic Surgery

## 2016-04-05 ENCOUNTER — Ambulatory Visit (INDEPENDENT_AMBULATORY_CARE_PROVIDER_SITE_OTHER): Payer: BLUE CROSS/BLUE SHIELD | Admitting: Cardiothoracic Surgery

## 2016-04-05 VITALS — BP 155/96 | HR 88 | Resp 20 | Ht 71.0 in | Wt 150.0 lb

## 2016-04-05 DIAGNOSIS — I10 Essential (primary) hypertension: Secondary | ICD-10-CM | POA: Diagnosis not present

## 2016-04-05 DIAGNOSIS — R918 Other nonspecific abnormal finding of lung field: Secondary | ICD-10-CM | POA: Diagnosis not present

## 2016-04-05 DIAGNOSIS — F17290 Nicotine dependence, other tobacco product, uncomplicated: Secondary | ICD-10-CM | POA: Diagnosis not present

## 2016-04-05 DIAGNOSIS — J984 Other disorders of lung: Secondary | ICD-10-CM | POA: Diagnosis not present

## 2016-04-05 NOTE — Progress Notes (Signed)
[address]       [place] [id number]             [phone]                    [name] BrakemanRoger D Ross [facility] Record #[id number] Date of Birth: 11/09/1960  Referring: Lewis Moccasinewey, Elizabeth R, MD Primary Care: Maryelizabeth RowanEWEY,ELIZABETH, MD  Chief Complaint:    Chief Complaint  Patient presents with  . Routine Post Op    s/p ENB and biopsy of right upper lobe lung nodule 04/02/2016    History of Present Illness:    [name] Ross 55 y.o. male is seen in the office two months ago  for Abnormal chest x-ray and CT scan. The patient had a chest x-ray in March of this year demonstrating a right upper lobe lung nodule. He was seen in urgent care because of cough and pulmonary complaints. Follow-up chest x-ray done in the primary care office in late May showed persistent of right upper lobe lung lesion, this is confirmed on CT scan. The patient is a long-term at least a pack a day for more than 30 years. Since previously seen he has significantly cut down on his smoking but not stopped.  He does have environmental/work exposure to dust but is not aware of any specific asbestos exposure. He denies any previous cardiac history, denies tuberculosis exposure.  Although resection was recommended to the patient 2 months ago he wished to wait and have a follow-up scan  The patient did agree to proceeding with the navigation bronchoscopy, which was done several days ago. Final path on this was benign.   Current Activity/ Functional Status:  Patient is independent with mobility/ambulation, transfers, ADL's, IADL's.   Zubrod Score: At the time of surgery this patient's most appropriate activity status/level should be described as: [x]     0    Normal activity, no symptoms []     1    Restricted in physical strenuous activity but ambulatory, able to do out light work []     2    Ambulatory and capable of self care, unable to do work activities, up and about               >50 % of  waking hours                              []     3    Only limited self care, in bed greater than 50% of waking hours []     4    Completely disabled, no self care, confined to bed or chair []     5    Moribund   Past Medical History:  Diagnosis Date  . Back pain   . Chronic bronchitis (HCC)   . COPD (chronic obstructive pulmonary disease) (HCC)   . GERD (gastroesophageal reflux disease)   . Multiple lung nodules   . Tobacco abuse     Past Surgical History:  Procedure Laterality Date  . APPENDECTOMY    . BACK SURGERY    . VIDEO BRONCHOSCOPY WITH ENDOBRONCHIAL NAVIGATION N/A 04/02/2016   Procedure: VIDEO BRONCHOSCOPY WITH ENDOBRONCHIAL NAVIGATION;  Surgeon: Delight OvensEdward B Nakya Weyand, MD;  [facility];  Service: Thoracic;  Laterality: N/A;    No family history on file.  Social History   Social History  . Marital status: Single    Spouse  name: N/A  . Number of children: N/A  . Years of education: N/A   Occupational History  . Not on file.   Social History Main Topics  . Smoking status: Current Every Day Smoker    Packs/day: 1.00    Years: 20.00  . Smokeless tobacco: Never Used  . Alcohol use Yes     Comment: daily  . Drug use: No  . Sexual activity: Not on file   Other Topics Concern  . Not on file   Social History Narrative  . No narrative on file    History  Smoking Status  . Current Every Day Smoker  . Packs/day: 1.00  . Years: 20.00  Smokeless Tobacco  . Never Used    History  Alcohol Use  . Yes    Comment: daily     Allergies  Allergen Reactions  . Codeine Hives  . Demerol [Meperidine] Hives    Current Outpatient Prescriptions  Medication Sig Dispense Refill  . Aspirin-Acetaminophen-Caffeine (GOODY HEADACHE PO) Take 1 packet by mouth daily as needed (headache, pain).    Marland Kitchen omeprazole (PRILOSEC OTC) 20 MG tablet Take 20 mg by mouth daily.     No current facility-administered medications for this visit.       Review of Systems:     Cardiac  Review of Systems: Y or N  Chest Pain [  n  ]  Resting SOB [ n  ] Exertional SOB  [ n ]  Orthopnea [ n ]   Pedal Edema [n   ]    Palpitations [n  ] Syncope  [ n ]   Presyncope [   n]  General Review of Systems: [Y] = yes [  ]=no Constitional: recent weight change [  n];  Wt loss over the last 3 months [   ] anorexia [  ]; fatigue [  ]; nausea [  ]; night sweats [  ]; fever [  ]; or chills [  ];          Dental: poor dentition[  ]; Last Dentist visit: dentures  Eye : blurred vision [  ]; diplopia [   ]; vision changes [  ];  Amaurosis fugax[  ]; Resp: cough [  ];  wheezing[  ];  hemoptysis[  ]; shortness of breath[  ]; paroxysmal nocturnal dyspnea[  ]; dyspnea on exertion[  ]; or orthopnea[  ];  GI:  gallstones[  ], vomiting[  ];  dysphagia[  ]; melena[  ];  hematochezia [  ]; heartburn[  ];   Hx of  Colonoscopy[  ]; GU: kidney stones [  ]; hematuria[  ];   dysuria [  ];  nocturia[  ];  history of     obstruction [  ]; urinary frequency [  ]             Skin: rash, swelling[  ];, hair loss[  ];  peripheral edema[  ];  or itching[  ]; Musculosketetal: myalgias[  ];  joint swelling[  ];  joint erythema[  ];  joint pain[  ];  back pain[  ];  Heme/Lymph: bruising[  ];  bleeding[  ];  anemia[  ];  Neuro: TIA[  ];  headaches[  ];  stroke[  ];  vertigo[  ];  seizures[  ];   paresthesias[  ];  difficulty walking[n  ];  Psych:depression[  ]; anxiety[  ];  Endocrine: diabetes[ n ];  thyroid dysfunction[n  ];  Immunizations: Flu  up to date [?  ]; Pneumococcal up to date [ ? ];  Other:  Physical Exam: BP (!) 155/96 (BP Location: Right Arm, Patient Position: Sitting, Cuff Size: Small)   Pulse 88   Resp 20   Ht 5\' 11"  (1.803 m)   Wt 150 lb (68 kg)   SpO2 98% Comment: RA  BMI 20.92 kg/m   PHYSICAL EXAMINATION: General appearance: alert, cooperative, appears stated age and no distress Head: Normocephalic, without obvious abnormality, atraumatic Neck: no adenopathy, no carotid bruit, no JVD, supple,  symmetrical, trachea midline and thyroid not enlarged, symmetric, no tenderness/mass/nodules Lymph nodes: Cervical, supraclavicular, and axillary nodes normal. Resp: clear to auscultation bilaterally Back: symmetric, no curvature. ROM normal. No CVA tenderness. Cardio: regular rate and rhythm, S1, S2 normal, no murmur, click, rub or gallop GI: soft, non-tender; bowel sounds normal; no masses,  no organomegaly Extremities: extremities normal, atraumatic, no cyanosis or edema and Homans sign is negative, no sign of DVT Neurologic: Grossly normal  Diagnostic Studies & Laboratory data:     Recent Radiology Findings: Ct Super D Chest Wo Contrast  Result Date: 03/22/2016 CLINICAL DATA:  Preop for right lung biopsy. EXAM: CT CHEST WITHOUT CONTRAST TECHNIQUE: Multidetector CT imaging of the chest was performed using thin slice collimation for electromagnetic bronchoscopy planning purposes, without intravenous contrast. COMPARISON:  Chest CT 12/16/2015 and PET-CT 01/10/2016 FINDINGS: Chest wall: No chest wall mass, supraclavicular or axillary lymphadenopathy. The thyroid gland is normal. Cardiovascular: The heart is normal in size. No pericardial effusion. Stable tortuosity and calcification of the thoracic aorta. Stable coronary artery calcifications. Mediastinum/Nodes: No new mediastinal or hilar mass or adenopathy. Small scattered lymph nodes are stable. These were not metabolically active on the PET-CT. The esophagus is grossly normal. Lungs/Pleura: Stable advanced emphysematous changes and areas of pulmonary scarring. The irregular right upper lobe density is stable. It measures a maximum of 31.5 x 24 mm on the sagittal sequence. 6.5 mm right upper lobe nodule on image number 54 is also stable. No new pulmonary lesions. No acute overlying pulmonary findings. No pleural effusion. Upper Abdomen: Stable large left renal cyst. Stable hepatic cysts. No adrenal gland lesions. Stable aortic calcifications.  Musculoskeletal: No significant bony findings. IMPRESSION: 1. Stable irregular right upper lobe lesion and smaller right upper lobe pulmonary nodule. 2. No mediastinal or hilar mass or adenopathy. 3. Stable underline emphysematous changes and areas of pulmonary scarring. Electronically Signed   By: Rudie Meyer M.D.   On: 03/22/2016 09:03   Nm Pet Image Initial (pi) Skull Base To Thigh  01/10/2016  CLINICAL DATA:  Initial treatment strategy for 2 discrete right upper lobe pulmonary nodules. EXAM: NUCLEAR MEDICINE PET SKULL BASE TO THIGH TECHNIQUE: 13.0 mCi F-18 FDG was injected intravenously. Full-ring PET imaging was performed from the skull base to thigh after the radiotracer. CT data was obtained and used for attenuation correction and anatomic localization. FASTING BLOOD GLUCOSE:  Value: 91 mg/dl COMPARISON:  CT scan 16/04/9603 FINDINGS: NECK No hypermetabolic lymph nodes in the neck. CHEST Spiculated posterior right upper lobe nodule is again identified. This lesion is the nodule measured at about 2 cm on the previous diagnostic CT and shows low level FDG accumulation with SUV max = 1.9. The second smaller pulmonary nodule is seen more caudally in the right upper lobe measuring up to about 8 mm. No detectable FDG uptake in this smaller nodule. No evidence for hypermetabolic metastatic disease in the mediastinum or hilar regions. Insert calcium heart Emphysema again noted  in the lungs bilaterally. ABDOMEN/PELVIS No abnormal hypermetabolic activity within the liver, pancreas, adrenal glands, or spleen. No hypermetabolic lymph nodes in the abdomen or pelvis. There is abdominal aortic atherosclerosis without aneurysm. Abdominal aorta measures up to 2.8 cm in diameter. 7.0 cm exophytic water density lesion upper pole left kidney is compatible with a cyst in shows no hypermetabolism on PET imaging. The colon does not show diffuse FDG mural uptake on today's study, but there is a single discrete nodular focus of  FDG accumulation in the mid to distal sigmoid colon with SUV max = 5.3. This is in a background of diffuse left colonic diverticulosis. SKELETON No focal hypermetabolic activity to suggest skeletal metastasis. IMPRESSION: 1. The larger, more cranial spiculated pulmonary nodule in the right upper lobe shows low level FDG uptake. Well differentiated or low-grade neoplasm remains a concern. 2. 8 mm nodule also identified in the right upper lobe is without evidence of discernible FDG accumulation, but this may well be related to the small size of the nodule. 3. No evidence for hypermetabolic metastatic disease in the chest, abdomen, or pelvis. 4. A single nodular focus of FDG accumulation in the colon is identified in the mid to distal sigmoid segment, in a region of fairly prominent diverticulosis. Given the focality of this uptake, a discrete area of colonic inflammation or colonic adenoma/carcinoma would be considerations. Correlation with colorectal cancer screening history recommended. 5. Abdominal aortic atherosclerosis. Electronically Signed   By: Kennith Center M.D.   On: 01/10/2016 14:24      Dg Chest 2 View  12/13/2015  CLINICAL DATA:  Followup lung nodule, patient smokes EXAM: CHEST  2 VIEW COMPARISON:  10/12/2015 FINDINGS: Irregular 12 mm right upper lobe nodule persists. Heart size and vascular pattern normal. Left lung is clear. No pleural effusions. IMPRESSION: Persistent irregular 12 mm upper lobe pulmonary nodule on the right. CT thorax preferably with contrast is suggested. These results will be called to the ordering clinician or representative by the Radiologist Assistant, and communication documented in the PACS or zVision Dashboard. Electronically Signed   By: Esperanza Heir M.D.   On: 12/13/2015 14:15   Ct Chest W Contrast  12/16/2015  CLINICAL DATA:  Lung nodule on recent chest x-ray EXAM: CT CHEST WITH CONTRAST TECHNIQUE: Multidetector CT imaging of the chest was performed during  intravenous contrast administration. CONTRAST:  75mL ISOVUE-300 IOPAMIDOL (ISOVUE-300) INJECTION 61% COMPARISON:  12/13/2015 FINDINGS: The lungs are well aerated bilaterally. Diffuse emphysematous changes are identified. The left lung shows no evidence of focal infiltrate. The right lung demonstrates an 8 mm nodular density best seen on image number 69 of series 6 as well as a irregular spiculated lesion in the right upper lobe posteriorly which measures at least 2 cm in greatest dimension. Surrounding architectural distortion is noted in these changes are highly suspicious for underlying neoplasm. The thoracic inlet is within normal limits. Aortic calcifications are seen without aneurysmal dilatation or dissection. The pulmonary artery shows no large central pulmonary embolus. Scattered small mediastinal lymph nodes are seen. A single 10 mm short axis lymph node is noted in the right hilum. The visualized upper abdomen demonstrates cystic changes of the left kidney and liver. A 9 mm short axis portacaval node is noted as well. The osseous structures show no acute abnormality. IMPRESSION: Multiple nodules within the right upper lobe. The largest of these measures at least 2 cm with significant architectural distortion and spiculation. These changes are consistent with pulmonary neoplasm till proven otherwise.  Tissue sampling and further workup with a PET-CT may be helpful. These results will be called to the ordering clinician or representative by the Radiologist Assistant, and communication documented in the PACS or zVision Dashboard. Electronically Signed   By: Alcide Clever M.D.   On: 12/16/2015 16:37     I have independently reviewed the above radiology studies  and reviewed the findings with the patient.  Interpretation: The FVC, FEV1, FEV1/FVC ratio and FEF25-75% are reduced indicating airway obstruction. The FVC is reduced relative to the SVC indicating air trapping. . While the vital capacity and total  lung capacity are within normal limits, the RV/TLC ratio is increased. Following administration of bronchodilators, there is a significant response indicated by the increased FVC. The reduced diffusing capacity indicates a moderate loss of functional alveolar capillary surface. However, the diffusing capacity was not corrected for the patient's hemoglobin. Pulmonary Function Diagnosis: Moderate Obstructive Airways Disease with reversibiility Restriction -Probable Moderate Diffusion Defect  FEV1= 2.34 60% DLCO 19.73 58%   Recent Lab Findings: Lab Results  Component Value Date   WBC 9.9 03/29/2016   HGB 14.8 03/29/2016   HCT 44.0 03/29/2016   PLT 295 03/29/2016   GLUCOSE 110 (H) 03/29/2016   ALT 20 03/29/2016   AST 20 03/29/2016   NA 140 03/29/2016   K 4.2 03/29/2016   CL 105 03/29/2016   CREATININE 0.88 03/29/2016   BUN 14 03/29/2016   CO2 25 03/29/2016   INR 0.95 03/29/2016      Assessment / Plan:   1. Multiple lung nodules right upper lobe and long-term smoker, suggestive of clinical stage IIb (two separate lung nodules same lobe), assuming no nodal involvement. Pulmonary function studies have been done demonstrated approximately 60% predicted FEV1 and DLCO. The patient has significantly cut down on his smoking. The cranial spiculated pulmonary nodule in the right upper lobe shows low level FDG uptake. Well differentiated or low-grade neoplasm remains a concern, the second 8 mm nodule also identified in the right upper lobe is without evidence of discernible FDG accumulation, but this may well be related to the small size of the nodule.  There is No evidence for hypermetabolic metastatic disease in the chest, abdomen, or pelvis. I discussed in detail with the patient the possibility of low grade malignancy involving the right upper lobe we discussed proceeding  with surgical resection. Because of family concerns job concerns the patient prefered to wait before resection but   agreeded  toa follow-up CT scan in 2 months( 3 months after original CT) which was done today.   Stable irregular right upper lobe lesion and smaller right upper lobe pulmonary nodule.- I reviewed with patient the findings on the follow-up CT scan, the right upper lobe mass has not increased in size appreciably, but remains highly suspicious in a long-term smoker.- The pathology from navigation bronchoscopy to the right upper lobe lesion is benign  A single nodular focus of FDG accumulation in the colon is identified in the mid to distal sigmoid segment, in a region of fairly prominent diverticulosis. Given the focality of this uptake, a discrete area of colonic inflammation or colonic adenoma/carcinoma would be considerations. Correlation with colorectal cancer screening history recommended. Patient saw  GI  And procedure was done, he reports it was OK.   Moderate obstructive airway disease with reversibility moderate diffusion deficit- PFTs done 12/27/2015   I reviewed the pathologic findings with the patient, still have told him that this is a split suspicious lesion specially  in his long-term smoker. He is agreeable to a follow-up CT scan in 3 months, but any surgery he would like to wait till beginning of the year. He has agreed and saw his primary care doctor today and started on blood pressure medication.  Quantitferron TD gold - test done during his recent hospital time was negative.    Delight Ovens MD      [address] [place] [place] [address] Office [phone]   Beeper [phone]  04/05/2016 12:58 PM

## 2016-04-07 LAB — AEROBIC/ANAEROBIC CULTURE W GRAM STAIN (SURGICAL/DEEP WOUND)
Culture: NO GROWTH
Gram Stain: NONE SEEN

## 2016-05-01 LAB — FUNGUS CULTURE WITH STAIN

## 2016-05-01 LAB — FUNGAL ORGANISM REFLEX

## 2016-05-01 LAB — FUNGUS CULTURE RESULT

## 2016-05-14 DIAGNOSIS — I10 Essential (primary) hypertension: Secondary | ICD-10-CM | POA: Diagnosis not present

## 2016-05-14 DIAGNOSIS — F17290 Nicotine dependence, other tobacco product, uncomplicated: Secondary | ICD-10-CM | POA: Diagnosis not present

## 2016-05-14 DIAGNOSIS — K219 Gastro-esophageal reflux disease without esophagitis: Secondary | ICD-10-CM | POA: Diagnosis not present

## 2016-05-14 DIAGNOSIS — Z6820 Body mass index (BMI) 20.0-20.9, adult: Secondary | ICD-10-CM | POA: Diagnosis not present

## 2016-05-15 LAB — ACID FAST CULTURE WITH REFLEXED SENSITIVITIES (MYCOBACTERIA): Acid Fast Culture: NEGATIVE

## 2016-06-08 ENCOUNTER — Other Ambulatory Visit: Payer: Self-pay | Admitting: Cardiothoracic Surgery

## 2016-06-08 DIAGNOSIS — R911 Solitary pulmonary nodule: Secondary | ICD-10-CM

## 2016-06-13 DIAGNOSIS — I10 Essential (primary) hypertension: Secondary | ICD-10-CM | POA: Diagnosis not present

## 2016-06-13 DIAGNOSIS — Z6820 Body mass index (BMI) 20.0-20.9, adult: Secondary | ICD-10-CM | POA: Diagnosis not present

## 2016-06-13 DIAGNOSIS — J984 Other disorders of lung: Secondary | ICD-10-CM | POA: Diagnosis not present

## 2016-06-13 DIAGNOSIS — J329 Chronic sinusitis, unspecified: Secondary | ICD-10-CM | POA: Diagnosis not present

## 2016-07-04 DIAGNOSIS — K219 Gastro-esophageal reflux disease without esophagitis: Secondary | ICD-10-CM | POA: Diagnosis not present

## 2016-07-04 DIAGNOSIS — F17290 Nicotine dependence, other tobacco product, uncomplicated: Secondary | ICD-10-CM | POA: Diagnosis not present

## 2016-07-04 DIAGNOSIS — Z6820 Body mass index (BMI) 20.0-20.9, adult: Secondary | ICD-10-CM | POA: Diagnosis not present

## 2016-07-04 DIAGNOSIS — I10 Essential (primary) hypertension: Secondary | ICD-10-CM | POA: Diagnosis not present

## 2016-07-25 ENCOUNTER — Other Ambulatory Visit: Payer: Self-pay | Admitting: Cardiothoracic Surgery

## 2016-07-26 ENCOUNTER — Ambulatory Visit: Payer: Self-pay | Admitting: Cardiothoracic Surgery

## 2016-07-26 ENCOUNTER — Other Ambulatory Visit: Payer: BLUE CROSS/BLUE SHIELD

## 2016-08-06 ENCOUNTER — Other Ambulatory Visit: Payer: BLUE CROSS/BLUE SHIELD

## 2016-08-09 ENCOUNTER — Ambulatory Visit: Payer: Self-pay | Admitting: Cardiothoracic Surgery

## 2016-08-09 ENCOUNTER — Other Ambulatory Visit: Payer: Self-pay

## 2016-08-15 DIAGNOSIS — Z125 Encounter for screening for malignant neoplasm of prostate: Secondary | ICD-10-CM | POA: Diagnosis not present

## 2016-08-15 DIAGNOSIS — Z0000 Encounter for general adult medical examination without abnormal findings: Secondary | ICD-10-CM | POA: Diagnosis not present

## 2016-08-15 DIAGNOSIS — Z13220 Encounter for screening for lipoid disorders: Secondary | ICD-10-CM | POA: Diagnosis not present

## 2016-08-21 DIAGNOSIS — Z23 Encounter for immunization: Secondary | ICD-10-CM | POA: Diagnosis not present

## 2016-08-21 DIAGNOSIS — Z0000 Encounter for general adult medical examination without abnormal findings: Secondary | ICD-10-CM | POA: Diagnosis not present

## 2016-08-21 DIAGNOSIS — Z6821 Body mass index (BMI) 21.0-21.9, adult: Secondary | ICD-10-CM | POA: Diagnosis not present

## 2016-08-30 ENCOUNTER — Ambulatory Visit (INDEPENDENT_AMBULATORY_CARE_PROVIDER_SITE_OTHER): Payer: BLUE CROSS/BLUE SHIELD | Admitting: Cardiothoracic Surgery

## 2016-08-30 ENCOUNTER — Ambulatory Visit
Admission: RE | Admit: 2016-08-30 | Discharge: 2016-08-30 | Disposition: A | Discharge status: Home / Self Care | Payer: BLUE CROSS/BLUE SHIELD | Source: Ambulatory Visit | Attending: Cardiothoracic Surgery | Admitting: Cardiothoracic Surgery

## 2016-08-30 VITALS — BP 145/85 | HR 79 | Resp 20 | Ht 71.0 in | Wt 155.0 lb

## 2016-08-30 DIAGNOSIS — R918 Other nonspecific abnormal finding of lung field: Secondary | ICD-10-CM

## 2016-08-30 DIAGNOSIS — R911 Solitary pulmonary nodule: Secondary | ICD-10-CM

## 2016-08-30 NOTE — Progress Notes (Signed)
[address]       [place] [address]             [phone]                    [name] Ross [facility] Medical Record #[id number] Date of Birth: 1960-08-19  Referring: Lewis Moccasin, MD Primary Care: Maryelizabeth Rowan, MD  Chief Complaint:    Chief Complaint  Patient presents with  . Lung Mass    4 month f/u with Chest CT    History of Present Illness:    [name] Ross 56 y.o. male is seen in the office June 2017   for Abnormal chest x-ray and CT scan. The patient had a chest x-ray in March 207  of this year demonstrating a right upper lobe lung nodule. He was seen in urgent care because of cough and pulmonary complaints. Follow-up chest x-ray done in the primary care office in late May 2017  showed persistent of right upper lobe lung lesion, this is confirmed on CT scan. The patient is a long-term at least a pack a day for more than 30 years. Since previously seen he has significantly cut down on his smoking but not stopped.  He does have environmental/work exposure to dust but is not aware of any specific asbestos exposure. He denies any previous cardiac history, denies tuberculosis exposure.  Although resection was recommended to the previously  he wished to wait and have a follow-up scan  The patient did agree to proceeding with the navigation bronchoscopy, which was 03/2016 . Final path on this was benign.   Current Activity/ Functional Status:  Patient is independent with mobility/ambulation, transfers, ADL's, IADL's.   Zubrod Score: At the time of surgery this patient's most appropriate activity status/level should be described as: [x]     0    Normal activity, no symptoms []     1    Restricted in physical strenuous activity but ambulatory, able to do out light work []     2    Ambulatory and capable of self care, unable to do work activities, up and about               >50 % of waking hours                              []     3    Only  limited self care, in bed greater than 50% of waking hours []     4    Completely disabled, no self care, confined to bed or chair []     5    Moribund   Past Medical History:  Diagnosis Date  . Back pain   . Chronic bronchitis (HCC)   . COPD (chronic obstructive pulmonary disease) (HCC)   . GERD (gastroesophageal reflux disease)   . Multiple lung nodules   . Tobacco abuse     Past Surgical History:  Procedure Laterality Date  . APPENDECTOMY    . BACK SURGERY    . VIDEO BRONCHOSCOPY WITH ENDOBRONCHIAL NAVIGATION N/A 04/02/2016   Procedure: VIDEO BRONCHOSCOPY WITH ENDOBRONCHIAL NAVIGATION;  Surgeon: Delight Ovens, MD;  Location: [facility] OR;  Service: Thoracic;  Laterality: N/A;    No family history on file.  Social History   Social History  . Marital status: Single    Spouse name: N/A  . Number of  children: N/A  . Years of education: N/A   Occupational History  . Not on file.   Social History Main Topics  . Smoking status: Current Every Day Smoker    Packs/day: 1.00    Years: 20.00  . Smokeless tobacco: Never Used  . Alcohol use Yes     Comment: daily  . Drug use: No  . Sexual activity: Not on file   Other Topics Concern  . Not on file   Social History Narrative  . No narrative on file    History  Smoking Status  . Current Every Day Smoker  . Packs/day: 1.00  . Years: 20.00  Smokeless Tobacco  . Never Used    History  Alcohol Use  . Yes    Comment: daily     Allergies  Allergen Reactions  . Codeine Hives  . Demerol [Meperidine] Hives    Current Outpatient Prescriptions  Medication Sig Dispense Refill  . Aspirin-Acetaminophen-Caffeine (GOODY HEADACHE PO) Take 1 packet by mouth daily as needed (headache, pain).    Marland Kitchen losartan-hydrochlorothiazide (HYZAAR) 100-25 MG tablet Take 1 tablet by mouth daily.  3  . omeprazole (PRILOSEC OTC) 20 MG tablet Take 20 mg by mouth daily.     No current facility-administered medications for this visit.        Review of Systems:     Cardiac Review of Systems: Y or N  Chest Pain [  n  ]  Resting SOB [ n  ] Exertional SOB  [ n ]  Orthopnea [ n ]   Pedal Edema [n   ]    Palpitations [n  ] Syncope  [ n ]   Presyncope [   n]  General Review of Systems: [Y] = yes [  ]=no Constitional: recent weight change [  n];  Wt loss over the last 3 months [   ] anorexia [  ]; fatigue [  ]; nausea [  ]; night sweats [  ]; fever [  ]; or chills [  ];          Dental: poor dentition[  ]; Last Dentist visit: dentures  Eye : blurred vision [  ]; diplopia [   ]; vision changes [  ];  Amaurosis fugax[  ]; Resp: cough [  ];  wheezing[  ];  hemoptysis[  ]; shortness of breath[  ]; paroxysmal nocturnal dyspnea[  ]; dyspnea on exertion[  ]; or orthopnea[  ];  GI:  gallstones[  ], vomiting[  ];  dysphagia[  ]; melena[  ];  hematochezia [  ]; heartburn[  ];   Hx of  Colonoscopy[  ]; GU: kidney stones [  ]; hematuria[  ];   dysuria [  ];  nocturia[  ];  history of     obstruction [  ]; urinary frequency [  ]             Skin: rash, swelling[  ];, hair loss[  ];  peripheral edema[  ];  or itching[  ]; Musculosketetal: myalgias[  ];  joint swelling[  ];  joint erythema[  ];  joint pain[  ];  back pain[  ];  Heme/Lymph: bruising[  ];  bleeding[  ];  anemia[  ];  Neuro: TIA[  ];  headaches[  ];  stroke[  ];  vertigo[  ];  seizures[  ];   paresthesias[  ];  difficulty walking[n  ];  Psych:depression[  ]; anxiety[  ];  Endocrine: diabetes[ n ];  thyroid dysfunction[n  ];  Immunizations: Flu up to date [?  ]; Pneumococcal up to date [ ? ];  Other:  Physical Exam: BP (!) 145/85   Pulse 79   Resp 20   Ht 5\' 11"  (1.803 m)   Wt 155 lb (70.3 kg)   SpO2 97%   BMI 21.62 kg/m   PHYSICAL EXAMINATION: General appearance: alert, cooperative, appears stated age and no distress Head: Normocephalic, without obvious abnormality, atraumatic Neck: no adenopathy, no carotid bruit, no JVD, supple, symmetrical, trachea midline and  thyroid not enlarged, symmetric, no tenderness/mass/nodules Lymph nodes: Cervical, supraclavicular, and axillary nodes normal. Resp: clear to auscultation bilaterally Back: symmetric, no curvature. ROM normal. No CVA tenderness. Cardio: regular rate and rhythm, S1, S2 normal, no murmur, click, rub or gallop GI: soft, non-tender; bowel sounds normal; no masses,  no organomegaly Extremities: extremities normal, atraumatic, no cyanosis or edema and Homans sign is negative, no sign of DVT Neurologic: Grossly normal  Diagnostic Studies & Laboratory data:     Recent Radiology Findings: Ct Chest Wo Contrast  Result Date: 08/30/2016 CLINICAL DATA:  Pulmonary nodules EXAM: CT CHEST WITHOUT CONTRAST TECHNIQUE: Multidetector CT imaging of the chest was performed following the standard protocol without IV contrast. COMPARISON:  Chest radiograph March 22, 2016 and PET-CT January 10, 2016 FINDINGS: Cardiovascular: There is no appreciable thoracic aortic aneurysm. There is mild calcification in the visualized great vessels. Visualized great vessels otherwise appear unremarkable on this noncontrast enhanced study. There are scattered foci of calcification throughout the aorta. There are scattered foci of coronary artery calcification. Pericardium is not appreciably thickened. Mediastinum/Nodes: There is a stable benign-appearing calcification in the left lobe of the thyroid. Thyroid otherwise appears unremarkable. There is no appreciable thoracic adenopathy. There are stable subcentimeter mediastinal lymph nodes present. There is a small hiatal hernia. Lungs/Pleura: There is a degree of underlying centrilobular emphysematous change. There is scarring in the medial apex on the left, stable. There is a somewhat spiculated nodular opacity in the posterior segment of the right upper lobe measuring 3.0 x 2.4 cm, best appreciated on sagittal slice 44 series 602 and directly compared with that same image on prior study. This  lesion appears stable compared to most recent prior study. There is a nodular opacity more inferiorly in the right upper lobe posterior segment measuring 6 x 6 mm, unchanged. It is best appreciated on axial slice 57 series 4. There is a new nodular opacity in the anterior segment of the right upper lobe best seen on axial slice 63 series 4. There is a new 3 mm nodular opacity in the posterior segment of the left upper lobe seen on axial slice 45 series 4. There is a new 3 mm nodular opacity in the anterior segment of the left upper lobe medially seen on axial slice 46 series 4. There is a new 3 mm nodular opacity in the anterior segment of the left upper lobe on axial slice 49 series 4. There is a new 4 mm nodular opacity with a tiny central cavitation in the posterior segment of the left upper lobe seen on axial slice 51 series 4 and sagittal slice 119 series 602. Note new larger lesions are evident. No edema or consolidation. There is no appreciable pleural effusion or pleural thickening. Upper Abdomen: There is atherosclerotic calcification in the visualized abdominal aorta. There is a cyst arising from the posterior upper to mid left kidney measuring 6.1 x 4.9 cm. Visualized upper abdominal structures are otherwise unremarkable.  Musculoskeletal: There are no blastic or lytic bone lesions. IMPRESSION: The dominant spiculated lesion in the right upper lobe remain stable. There is a 6 mm nodular lesion in the posterior segment right upper lobe which is likewise stable. There are several new 3-4 mm nodular opacities as summarized above. One of these lesions contains a tiny central area of cavitation. Given this change, a followup study in 3 months to assess for stability may well be warranted. No evident adenopathy by size criteria. Areas of atherosclerotic change. Scattered areas of coronary artery calcification noted. Electronically Signed   By: Bretta Bang III M.D.   On: 08/30/2016 15:09   Ct Super D  Chest Wo Contrast  Result Date: 03/22/2016 CLINICAL DATA:  Preop for right lung biopsy. EXAM: CT CHEST WITHOUT CONTRAST TECHNIQUE: Multidetector CT imaging of the chest was performed using thin slice collimation for electromagnetic bronchoscopy planning purposes, without intravenous contrast. COMPARISON:  Chest CT 12/16/2015 and PET-CT 01/10/2016 FINDINGS: Chest wall: No chest wall mass, supraclavicular or axillary lymphadenopathy. The thyroid gland is normal. Cardiovascular: The heart is normal in size. No pericardial effusion. Stable tortuosity and calcification of the thoracic aorta. Stable coronary artery calcifications. Mediastinum/Nodes: No new mediastinal or hilar mass or adenopathy. Small scattered lymph nodes are stable. These were not metabolically active on the PET-CT. The esophagus is grossly normal. Lungs/Pleura: Stable advanced emphysematous changes and areas of pulmonary scarring. The irregular right upper lobe density is stable. It measures a maximum of 31.5 x 24 mm on the sagittal sequence. 6.5 mm right upper lobe nodule on image number 54 is also stable. No new pulmonary lesions. No acute overlying pulmonary findings. No pleural effusion. Upper Abdomen: Stable large left renal cyst. Stable hepatic cysts. No adrenal gland lesions. Stable aortic calcifications. Musculoskeletal: No significant bony findings. IMPRESSION: 1. Stable irregular right upper lobe lesion and smaller right upper lobe pulmonary nodule. 2. No mediastinal or hilar mass or adenopathy. 3. Stable underline emphysematous changes and areas of pulmonary scarring. Electronically Signed   By: Rudie Meyer M.D.   On: 03/22/2016 09:03   Nm Pet Image Initial (pi) Skull Base To Thigh  01/10/2016  CLINICAL DATA:  Initial treatment strategy for 2 discrete right upper lobe pulmonary nodules. EXAM: NUCLEAR MEDICINE PET SKULL BASE TO THIGH TECHNIQUE: 13.0 mCi F-18 FDG was injected intravenously. Full-ring PET imaging was performed from the  skull base to thigh after the radiotracer. CT data was obtained and used for attenuation correction and anatomic localization. FASTING BLOOD GLUCOSE:  Value: 91 mg/dl COMPARISON:  CT scan 16/04/9603 FINDINGS: NECK No hypermetabolic lymph nodes in the neck. CHEST Spiculated posterior right upper lobe nodule is again identified. This lesion is the nodule measured at about 2 cm on the previous diagnostic CT and shows low level FDG accumulation with SUV max = 1.9. The second smaller pulmonary nodule is seen more caudally in the right upper lobe measuring up to about 8 mm. No detectable FDG uptake in this smaller nodule. No evidence for hypermetabolic metastatic disease in the mediastinum or hilar regions. Insert calcium heart Emphysema again noted in the lungs bilaterally. ABDOMEN/PELVIS No abnormal hypermetabolic activity within the liver, pancreas, adrenal glands, or spleen. No hypermetabolic lymph nodes in the abdomen or pelvis. There is abdominal aortic atherosclerosis without aneurysm. Abdominal aorta measures up to 2.8 cm in diameter. 7.0 cm exophytic water density lesion upper pole left kidney is compatible with a cyst in shows no hypermetabolism on PET imaging. The colon does not show diffuse  FDG mural uptake on today's study, but there is a single discrete nodular focus of FDG accumulation in the mid to distal sigmoid colon with SUV max = 5.3. This is in a background of diffuse left colonic diverticulosis. SKELETON No focal hypermetabolic activity to suggest skeletal metastasis. IMPRESSION: 1. The larger, more cranial spiculated pulmonary nodule in the right upper lobe shows low level FDG uptake. Well differentiated or low-grade neoplasm remains a concern. 2. 8 mm nodule also identified in the right upper lobe is without evidence of discernible FDG accumulation, but this may well be related to the small size of the nodule. 3. No evidence for hypermetabolic metastatic disease in the chest, abdomen, or pelvis. 4.  A single nodular focus of FDG accumulation in the colon is identified in the mid to distal sigmoid segment, in a region of fairly prominent diverticulosis. Given the focality of this uptake, a discrete area of colonic inflammation or colonic adenoma/carcinoma would be considerations. Correlation with colorectal cancer screening history recommended. 5. Abdominal aortic atherosclerosis. Electronically Signed   By: Kennith Center M.D.   On: 01/10/2016 14:24      Dg Chest 2 View  12/13/2015  CLINICAL DATA:  Followup lung nodule, patient smokes EXAM: CHEST  2 VIEW COMPARISON:  10/12/2015 FINDINGS: Irregular 12 mm right upper lobe nodule persists. Heart size and vascular pattern normal. Left lung is clear. No pleural effusions. IMPRESSION: Persistent irregular 12 mm upper lobe pulmonary nodule on the right. CT thorax preferably with contrast is suggested. These results will be called to the ordering clinician or representative by the Radiologist Assistant, and communication documented in the PACS or zVision Dashboard. Electronically Signed   By: Esperanza Heir M.D.   On: 12/13/2015 14:15   Ct Chest W Contrast  12/16/2015  CLINICAL DATA:  Lung nodule on recent chest x-ray EXAM: CT CHEST WITH CONTRAST TECHNIQUE: Multidetector CT imaging of the chest was performed during intravenous contrast administration. CONTRAST:  75mL ISOVUE-300 IOPAMIDOL (ISOVUE-300) INJECTION 61% COMPARISON:  12/13/2015 FINDINGS: The lungs are well aerated bilaterally. Diffuse emphysematous changes are identified. The left lung shows no evidence of focal infiltrate. The right lung demonstrates an 8 mm nodular density best seen on image number 69 of series 6 as well as a irregular spiculated lesion in the right upper lobe posteriorly which measures at least 2 cm in greatest dimension. Surrounding architectural distortion is noted in these changes are highly suspicious for underlying neoplasm. The thoracic inlet is within normal limits. Aortic  calcifications are seen without aneurysmal dilatation or dissection. The pulmonary artery shows no large central pulmonary embolus. Scattered small mediastinal lymph nodes are seen. A single 10 mm short axis lymph node is noted in the right hilum. The visualized upper abdomen demonstrates cystic changes of the left kidney and liver. A 9 mm short axis portacaval node is noted as well. The osseous structures show no acute abnormality. IMPRESSION: Multiple nodules within the right upper lobe. The largest of these measures at least 2 cm with significant architectural distortion and spiculation. These changes are consistent with pulmonary neoplasm till proven otherwise. Tissue sampling and further workup with a PET-CT may be helpful. These results will be called to the ordering clinician or representative by the Radiologist Assistant, and communication documented in the PACS or zVision Dashboard. Electronically Signed   By: Alcide Clever M.D.   On: 12/16/2015 16:37     I have independently reviewed the above radiology studies  and reviewed the findings with the patient.  Interpretation: The FVC, FEV1, FEV1/FVC ratio and FEF25-75% are reduced indicating airway obstruction. The FVC is reduced relative to the SVC indicating air trapping. . While the vital capacity and total lung capacity are within normal limits, the RV/TLC ratio is increased. Following administration of bronchodilators, there is a significant response indicated by the increased FVC. The reduced diffusing capacity indicates a moderate loss of functional alveolar capillary surface. However, the diffusing capacity was not corrected for the patient's hemoglobin. Pulmonary Function Diagnosis: Moderate Obstructive Airways Disease with reversibiility Restriction -Probable Moderate Diffusion Defect  FEV1= 2.34 60% DLCO 19.73 58%   Recent Lab Findings: Lab Results  Component Value Date   WBC 9.9 03/29/2016   HGB 14.8 03/29/2016   HCT 44.0  03/29/2016   PLT 295 03/29/2016   GLUCOSE 110 (H) 03/29/2016   ALT 20 03/29/2016   AST 20 03/29/2016   NA 140 03/29/2016   K 4.2 03/29/2016   CL 105 03/29/2016   CREATININE 0.88 03/29/2016   BUN 14 03/29/2016   CO2 25 03/29/2016   INR 0.95 03/29/2016      Assessment / Plan:   1. Multiple lung nodules right upper lobe and long-term smoker, suggestive of clinical stage IIb (two separate lung nodules same lobe), assuming no nodal involvement. Pulmonary function studies have been done demonstrated approximately 60% predicted FEV1 and DLCO. The patient has significantly cut down on his smoking. The cranial spiculated pulmonary nodule in the right upper lobe shows low level FDG uptake. Well differentiated or low-grade neoplasm remains a concern, the second 8 mm nodule also identified in the right upper lobe is without evidence of discernible FDG accumulation, but this may well be related to the small size of the nodule.  There is No evidence for hypermetabolic metastatic disease in the chest, abdomen, or pelvis. I discussed in detail with the patient the possibility of low grade malignancy involving the right upper lobe we discussed proceeding  with surgical resection. Because of family concerns job concerns the patient prefered to wait before resection but  agreeded  toa follow-up CT scan in 2 months( 3 months after original CT) which was done today.   Stable irregular right upper lobe lesion and smaller right upper lobe pulmonary nodule.- I reviewed with patient the findings on the follow-up CT scan, the right upper lobe mass has not increased in size appreciably, but remains highly suspicious in a long-term smoker.- The pathology from navigation bronchoscopy to the right upper lobe lesion is benign  A single nodular focus of FDG accumulation in the colon is identified in the mid to distal sigmoid segment, in a region of fairly prominent diverticulosis. Given the focality of this uptake, a discrete  area of colonic inflammation or colonic adenoma/carcinoma would be considerations. Correlation with colorectal cancer screening history recommended. Patient saw  GI  And procedure was done, he reports it was OK.   Moderate obstructive airway disease with reversibility moderate diffusion deficit- PFTs done 12/27/2015   I reviewed the pathologic findings with the patient, still have told him that this is a split suspicious lesion specially in his long-term smoker. He is agreeable to a follow-up CT scan in 3 months, but any surgery he would like to wait till beginning of the year. He has agreed and saw his primary care doctor today and started on blood pressure medication.  Quantitferron TD gold - test done during his recent hospital time was negative.    Delight Ovens MD  [address] [place] [id number] Office [phone]   Beeper [phone]  08/30/2016 3:34 PM

## 2016-08-31 ENCOUNTER — Other Ambulatory Visit: Payer: Self-pay | Admitting: *Deleted

## 2016-08-31 DIAGNOSIS — R911 Solitary pulmonary nodule: Secondary | ICD-10-CM

## 2016-09-27 ENCOUNTER — Other Ambulatory Visit: Payer: Self-pay | Admitting: Cardiothoracic Surgery

## 2016-09-27 DIAGNOSIS — R918 Other nonspecific abnormal finding of lung field: Secondary | ICD-10-CM

## 2016-10-05 ENCOUNTER — Ambulatory Visit (HOSPITAL_COMMUNITY)
Admission: RE | Admit: 2016-10-05 | Discharge: 2016-10-05 | Disposition: A | Discharge status: Home / Self Care | Payer: BLUE CROSS/BLUE SHIELD | Source: Ambulatory Visit | Attending: Cardiothoracic Surgery | Admitting: Cardiothoracic Surgery

## 2016-10-05 DIAGNOSIS — R911 Solitary pulmonary nodule: Secondary | ICD-10-CM | POA: Insufficient documentation

## 2016-10-05 LAB — PULMONARY FUNCTION TEST
DL/VA % pred: 59 %
DL/VA: 2.79 ml/min/mmHg/L
DLCO cor % pred: 50 %
DLCO cor: 17.15 ml/min/mmHg
DLCO unc % pred: 50 %
DLCO unc: 17.15 ml/min/mmHg
FEF 25-75 Post: 1.58 L/sec
FEF 25-75 Pre: 1.33 L/sec
FEF2575-%Change-Post: 18 %
FEF2575-%Pred-Post: 49 %
FEF2575-%Pred-Pre: 41 %
FEV1-%Change-Post: 5 %
FEV1-%Pred-Post: 64 %
FEV1-%Pred-Pre: 61 %
FEV1-Post: 2.48 L
FEV1-Pre: 2.35 L
FEV1FVC-%Change-Post: -2 %
FEV1FVC-%Pred-Pre: 87 %
FEV6-%Change-Post: 7 %
FEV6-%Pred-Post: 78 %
FEV6-%Pred-Pre: 73 %
FEV6-Post: 3.8 L
FEV6-Pre: 3.53 L
FEV6FVC-%Change-Post: 0 %
FEV6FVC-%Pred-Post: 102 %
FEV6FVC-%Pred-Pre: 103 %
FVC-%Change-Post: 8 %
FVC-%Pred-Post: 76 %
FVC-%Pred-Pre: 70 %
FVC-Post: 3.84 L
FVC-Pre: 3.56 L
Post FEV1/FVC ratio: 65 %
Post FEV6/FVC ratio: 99 %
Pre FEV1/FVC ratio: 66 %
Pre FEV6/FVC Ratio: 99 %
RV % pred: 161 %
RV: 3.62 L
TLC % pred: 100 %
TLC: 7.21 L

## 2016-10-05 MED ORDER — ALBUTEROL SULFATE (2.5 MG/3ML) 0.083% IN NEBU
2.5000 mg | INHALATION_SOLUTION | Freq: Once | RESPIRATORY_TRACT | Status: AC
  Administered 2016-10-05: 2.5 mg via RESPIRATORY_TRACT

## 2016-10-25 ENCOUNTER — Other Ambulatory Visit: Payer: Self-pay | Admitting: *Deleted

## 2016-10-25 ENCOUNTER — Encounter (HOSPITAL_COMMUNITY)
Admission: RE | Admit: 2016-10-25 | Discharge: 2016-10-25 | Disposition: A | Discharge status: Home / Self Care | Payer: BLUE CROSS/BLUE SHIELD | Source: Ambulatory Visit | Attending: Cardiothoracic Surgery | Admitting: Cardiothoracic Surgery

## 2016-10-25 ENCOUNTER — Encounter: Payer: Self-pay | Admitting: Cardiothoracic Surgery

## 2016-10-25 ENCOUNTER — Other Ambulatory Visit: Payer: Self-pay

## 2016-10-25 ENCOUNTER — Encounter (HOSPITAL_COMMUNITY): Payer: Self-pay

## 2016-10-25 ENCOUNTER — Ambulatory Visit (INDEPENDENT_AMBULATORY_CARE_PROVIDER_SITE_OTHER): Payer: BLUE CROSS/BLUE SHIELD | Admitting: Cardiothoracic Surgery

## 2016-10-25 ENCOUNTER — Ambulatory Visit
Admission: RE | Admit: 2016-10-25 | Discharge: 2016-10-25 | Disposition: A | Discharge status: Home / Self Care | Payer: Self-pay | Source: Ambulatory Visit | Attending: Cardiothoracic Surgery | Admitting: Cardiothoracic Surgery

## 2016-10-25 VITALS — BP 132/80 | HR 76 | Resp 20 | Ht 71.0 in | Wt 155.0 lb

## 2016-10-25 DIAGNOSIS — Z01812 Encounter for preprocedural laboratory examination: Secondary | ICD-10-CM | POA: Diagnosis not present

## 2016-10-25 DIAGNOSIS — J439 Emphysema, unspecified: Secondary | ICD-10-CM | POA: Insufficient documentation

## 2016-10-25 DIAGNOSIS — R911 Solitary pulmonary nodule: Secondary | ICD-10-CM

## 2016-10-25 DIAGNOSIS — R918 Other nonspecific abnormal finding of lung field: Secondary | ICD-10-CM | POA: Diagnosis not present

## 2016-10-25 DIAGNOSIS — J432 Centrilobular emphysema: Secondary | ICD-10-CM

## 2016-10-25 DIAGNOSIS — Z01818 Encounter for other preprocedural examination: Secondary | ICD-10-CM | POA: Insufficient documentation

## 2016-10-25 DIAGNOSIS — Z0183 Encounter for blood typing: Secondary | ICD-10-CM | POA: Diagnosis not present

## 2016-10-25 HISTORY — DX: Myoneural disorder, unspecified: G70.9

## 2016-10-25 LAB — COMPREHENSIVE METABOLIC PANEL
ALT: 15 U/L — ABNORMAL LOW (ref 17–63)
AST: 16 U/L (ref 15–41)
Albumin: 3.8 g/dL (ref 3.5–5.0)
Alkaline Phosphatase: 85 U/L (ref 38–126)
Anion gap: 11 (ref 5–15)
BUN: 18 mg/dL (ref 6–20)
CO2: 20 mmol/L — ABNORMAL LOW (ref 22–32)
Calcium: 9.2 mg/dL (ref 8.9–10.3)
Chloride: 99 mmol/L — ABNORMAL LOW (ref 101–111)
Creatinine, Ser: 0.98 mg/dL (ref 0.61–1.24)
GFR calc Af Amer: >60 mL/min (ref >60)
GFR calc non Af Amer: >60 mL/min (ref >60)
Glucose, Bld: 82 mg/dL (ref 65–99)
Potassium: 4.2 mmol/L (ref 3.5–5.1)
Sodium: 130 mmol/L — ABNORMAL LOW (ref 135–145)
Total Bilirubin: 0.2 mg/dL — ABNORMAL LOW (ref 0.3–1.2)
Total Protein: 6.7 g/dL (ref 6.5–8.1)

## 2016-10-25 LAB — BLOOD GAS, ARTERIAL
Acid-base deficit: 2.6 mmol/L — ABNORMAL HIGH (ref 0.0–2.0)
Bicarbonate: 21.4 mmol/L (ref 20.0–28.0)
Drawn by: 421801
FIO2: 21
O2 Saturation: 97.4 %
Patient temperature: 98.6
pCO2 arterial: 34.9 mmHg (ref 32.0–48.0)
pH, Arterial: 7.405 (ref 7.350–7.450)
pO2, Arterial: 94.1 mmHg (ref 83.0–108.0)

## 2016-10-25 LAB — CBC
HCT: 35 % — ABNORMAL LOW (ref 39.0–52.0)
Hemoglobin: 12 g/dL — ABNORMAL LOW (ref 13.0–17.0)
MCH: 30.8 pg (ref 26.0–34.0)
MCHC: 34.3 g/dL (ref 30.0–36.0)
MCV: 90 fL (ref 78.0–100.0)
Platelets: 305 10*3/uL (ref 150–400)
RBC: 3.89 MIL/uL — ABNORMAL LOW (ref 4.22–5.81)
RDW: 12.3 % (ref 11.5–15.5)
WBC: 7.9 10*3/uL (ref 4.0–10.5)

## 2016-10-25 LAB — ABO/RH: ABO/RH(D): O POS

## 2016-10-25 LAB — TYPE AND SCREEN
ABO/RH(D): O POS
Antibody Screen: NEGATIVE

## 2016-10-25 LAB — URINALYSIS, ROUTINE W REFLEX MICROSCOPIC
Bilirubin Urine: NEGATIVE
Glucose, UA: NEGATIVE mg/dL
Hgb urine dipstick: NEGATIVE
Ketones, ur: NEGATIVE mg/dL
Leukocytes, UA: NEGATIVE
Nitrite: NEGATIVE
Protein, ur: NEGATIVE mg/dL
Specific Gravity, Urine: 1.012 (ref 1.005–1.030)
pH: 5 (ref 5.0–8.0)

## 2016-10-25 LAB — PROTIME-INR
INR: 0.96
Prothrombin Time: 12.7 seconds (ref 11.4–15.2)

## 2016-10-25 LAB — SURGICAL PCR SCREEN
MRSA, PCR: NEGATIVE
Staphylococcus aureus: NEGATIVE

## 2016-10-25 LAB — APTT: aPTT: 36 seconds (ref 24–36)

## 2016-10-25 NOTE — Pre-Procedure Instructions (Signed)
[name] MCCONAHA  10/25/2016      [id number] [place], [place] - [address] [place] [address] Phone: [phone] Fax: [phone]    Your procedure is scheduled on 10/29/2016  Report to [facility] Admitting at 5:30 A.M.  Call this number if you have problems the morning of surgery:  [phone]   Remember:  Do not eat food or drink liquids after midnight.  On .  Place clean sheets on your bed the night of  your first shower and do not sleep with pets.  Day of Surgery  Do not apply any lotions/deodorants the morning of surgery.  Please wear clean clothes to the hospital/surgery center.  Please read over the following fact sheets that you were given. Pain Booklet, Coughing and Deep Breathing, MRSA Information and Surgical Site Infection Prevention

## 2016-10-25 NOTE — Progress Notes (Signed)
[address]       [place] [address]             [phone]                    [name] Ross [facility] Medical Record #[id number] Date of Birth: 05-23-1961  Referring: Lewis Moccasin, MD Primary Care: Maryelizabeth Rowan, MD  Chief Complaint:    Chief Complaint  Patient presents with  . Lung Mass    further discuss surgery scheduled for 10/29/16 and review Chest CT     History of Present Illness:    [name] Ross 56 y.o. male is seen in the office June 2017   for Abnormal chest x-ray and CT scan. The patient had a chest x-ray in March 207  of this year demonstrating a right upper lobe lung nodule. He was seen in urgent care because of cough and pulmonary complaints. Follow-up chest x-ray done in the primary care office in late May 2017  showed persistent of right upper lobe lung lesion, this is confirmed on CT scan. The patient is a long-term at least a pack a day for more than 30 years. Since previously seen he has significantly cut down on his smoking but not stopped.  He does have environmental/work exposure to dust but is not aware of any specific asbestos exposure. He denies any previous cardiac history, denies tuberculosis exposure.  The patient did agree to proceeding with the navigation bronchoscopy, which was 03/2016 . Final path on this was benign.     Current Activity/ Functional Status:  Patient is independent with mobility/ambulation, transfers, ADL's, IADL's.   Zubrod Score: At the time of surgery this patient's most appropriate activity status/level should be described as:     0    Normal activity, no symptoms     1    Restricted in physical strenuous activity but ambulatory, able to do out light work     2    Ambulatory and capable of self care, unable to do work activities, up and about               >50 % of waking hours                                  3    Only limited self care, in bed greater than 50% of waking  hours     4    Completely disabled, no self care, confined to bed or chair     5    Moribund   Past Medical History:  Diagnosis Date  . Back pain   . Chronic bronchitis (HCC)   . COPD (chronic obstructive pulmonary disease) (HCC)   . GERD (gastroesophageal reflux disease)   . Multiple lung nodules   . Tobacco abuse     Past Surgical History:  Procedure Laterality Date  . APPENDECTOMY    . BACK SURGERY    . VIDEO BRONCHOSCOPY WITH ENDOBRONCHIAL NAVIGATION N/A 04/02/2016   Procedure: VIDEO BRONCHOSCOPY WITH ENDOBRONCHIAL NAVIGATION;  Surgeon: Delight Ovens, MD;  Location: [facility] OR;  Service: Thoracic;  Laterality: N/A;    No family history on file.  Social History   Social History  . Marital status: Single    Spouse name: N/A  . Number of children: N/A  . Years of education: N/A   Occupational  History  . Not on file.   Social History Main Topics  . Smoking status: Current Every Day Smoker    Packs/day: 1.00    Years: 20.00  . Smokeless tobacco: Never Used  . Alcohol use Yes     Comment: daily  . Drug use: No  . Sexual activity: Not on file   Other Topics Concern  . Not on file   Social History Narrative  . No narrative on file    History  Smoking Status  . Current Every Day Smoker  . Packs/day: 1.00  . Years: 20.00  Smokeless Tobacco  . Never Used    History  Alcohol Use  . Yes    Comment: daily     Allergies  Allergen Reactions  . Codeine Hives  . Demerol [Meperidine] Hives    Current Outpatient Prescriptions  Medication Sig Dispense Refill  . Aspirin-Acetaminophen-Caffeine (GOODY HEADACHE PO) Take 1 packet by mouth daily as needed (headache, pain).    Marland Kitchen azelastine (ASTELIN) 0.1 % nasal spray Place 2 sprays into both nostrils 2 (two) times daily.  11  . losartan-hydrochlorothiazide (HYZAAR) 100-25 MG tablet Take 1 tablet by mouth daily.  3  . omeprazole (PRILOSEC OTC) 20 MG tablet Take 20 mg by mouth daily.     No current  facility-administered medications for this visit.       Review of Systems:     Cardiac Review of Systems: Y or N  Chest Pain [  n  ]  Resting SOB [ n  ] Exertional SOB  [ n ]  Orthopnea [ n ]   Pedal Edema [n   ]    Palpitations [n  ] Syncope  [ n ]   Presyncope [   n]  General Review of Systems: [Y] = yes [  ]=no Constitional: recent weight change [  n];  Wt loss over the last 3 months [   ] anorexia [  ]; fatigue [  ]; nausea [  ]; night sweats [  ]; fever [  ]; or chills [  ];          Dental: poor dentition[  ]; Last Dentist visit: dentures  Eye : blurred vision [  ]; diplopia [   ]; vision changes [  ];  Amaurosis fugax[  ]; Resp: cough [  ];  wheezing[  ];  hemoptysis[  ]; shortness of breath[  ]; paroxysmal nocturnal dyspnea[  ]; dyspnea on exertion[  ]; or orthopnea[  ];  GI:  gallstones[  ], vomiting[  ];  dysphagia[  ]; melena[  ];  hematochezia [  ]; heartburn[  ];   Hx of  Colonoscopy[  ]; GU: kidney stones [  ]; hematuria[  ];   dysuria [  ];  nocturia[  ];  history of     obstruction [  ]; urinary frequency [  ]             Skin: rash, swelling[  ];, hair loss[  ];  peripheral edema[  ];  or itching[  ]; Musculosketetal: myalgias[  ];  joint swelling[  ];  joint erythema[  ];  joint pain[  ];  back pain[  ];  Heme/Lymph: bruising[  ];  bleeding[  ];  anemia[  ];  Neuro: TIA[  ];  headaches[  ];  stroke[  ];  vertigo[  ];  seizures[  ];   paresthesias[  ];  difficulty walking[n  ];  Psych:depression[  ];  anxiety[  ];  Endocrine: diabetes[ n ];  thyroid dysfunction[n  ];  Immunizations: Flu up to date [?  ]; Pneumococcal up to date [ ? ];  Other:  Physical Exam: BP 132/80   Pulse 76   Resp 20   Ht  (1.803 m)   Wt 155 lb (70.3 kg)   SpO2 97% Comment: RA  BMI 21.62 kg/m   PHYSICAL EXAMINATION: General appearance: alert, cooperative, appears stated age and no distress Head: Normocephalic, without obvious abnormality, atraumatic Neck: no adenopathy, no carotid  bruit, no JVD, supple, symmetrical, trachea midline and thyroid not enlarged, symmetric, no tenderness/mass/nodules Lymph nodes: Cervical, supraclavicular, and axillary nodes normal. Resp: clear to auscultation bilaterally Back: symmetric, no curvature. ROM normal. No CVA tenderness. Cardio: regular rate and rhythm, S1, S2 normal, no murmur, click, rub or gallop GI: soft, non-tender; bowel sounds normal; no masses,  no organomegaly Extremities: extremities normal, atraumatic, no cyanosis or edema and Homans sign is negative, no sign of DVT Neurologic: Grossly normal  Diagnostic Studies & Laboratory data:     Recent Radiology Findings: Ct Chest Wo Contrast  Result Date: 08/30/2016 CLINICAL DATA:  Pulmonary nodules EXAM: CT CHEST WITHOUT CONTRAST TECHNIQUE: Multidetector CT imaging of the chest was performed following the standard protocol without IV contrast. COMPARISON:  Chest radiograph March 22, 2016 and PET-CT January 10, 2016 FINDINGS: Cardiovascular: There is no appreciable thoracic aortic aneurysm. There is mild calcification in the visualized great vessels. Visualized great vessels otherwise appear unremarkable on this noncontrast enhanced study. There are scattered foci of calcification throughout the aorta. There are scattered foci of coronary artery calcification. Pericardium is not appreciably thickened. Mediastinum/Nodes: There is a stable benign-appearing calcification in the left lobe of the thyroid. Thyroid otherwise appears unremarkable. There is no appreciable thoracic adenopathy. There are stable subcentimeter mediastinal lymph nodes present. There is a small hiatal hernia. Lungs/Pleura: There is a degree of underlying centrilobular emphysematous change. There is scarring in the medial apex on the left, stable. There is a somewhat spiculated nodular opacity in the posterior segment of the right upper lobe measuring 3.0 x 2.4 cm, best appreciated on sagittal slice 44 series 602 and  directly compared with that same image on prior study. This lesion appears stable compared to most recent prior study. There is a nodular opacity more inferiorly in the right upper lobe posterior segment measuring 6 x 6 mm, unchanged. It is best appreciated on axial slice 57 series 4. There is a new nodular opacity in the anterior segment of the right upper lobe best seen on axial slice 63 series 4. There is a new 3 mm nodular opacity in the posterior segment of the left upper lobe seen on axial slice 45 series 4. There is a new 3 mm nodular opacity in the anterior segment of the left upper lobe medially seen on axial slice 46 series 4. There is a new 3 mm nodular opacity in the anterior segment of the left upper lobe on axial slice 49 series 4. There is a new 4 mm nodular opacity with a tiny central cavitation in the posterior segment of the left upper lobe seen on axial slice 51 series 4 and sagittal slice 119 series 602. Note new larger lesions are evident. No edema or consolidation. There is no appreciable pleural effusion or pleural thickening. Upper Abdomen: There is atherosclerotic calcification in the visualized abdominal aorta. There is a cyst arising from the posterior upper to mid left kidney measuring 6.1 x  4.9 cm. Visualized upper abdominal structures are otherwise unremarkable. Musculoskeletal: There are no blastic or lytic bone lesions. IMPRESSION: The dominant spiculated lesion in the right upper lobe remain stable. There is a 6 mm nodular lesion in the posterior segment right upper lobe which is likewise stable. There are several new 3-4 mm nodular opacities as summarized above. One of these lesions contains a tiny central area of cavitation. Given this change, a followup study in 3 months to assess for stability may well be warranted. No evident adenopathy by size criteria. Areas of atherosclerotic change. Scattered areas of coronary artery calcification noted. Electronically Signed   By: Bretta Bang III M.D.   On: 08/30/2016 15:09   Ct Super D Chest Wo Contrast  Result Date: 03/22/2016 CLINICAL DATA:  Preop for right lung biopsy. EXAM: CT CHEST WITHOUT CONTRAST TECHNIQUE: Multidetector CT imaging of the chest was performed using thin slice collimation for electromagnetic bronchoscopy planning purposes, without intravenous contrast. COMPARISON:  Chest CT 12/16/2015 and PET-CT 01/10/2016 FINDINGS: Chest wall: No chest wall mass, supraclavicular or axillary lymphadenopathy. The thyroid gland is normal. Cardiovascular: The heart is normal in size. No pericardial effusion. Stable tortuosity and calcification of the thoracic aorta. Stable coronary artery calcifications. Mediastinum/Nodes: No new mediastinal or hilar mass or adenopathy. Small scattered lymph nodes are stable. These were not metabolically active on the PET-CT. The esophagus is grossly normal. Lungs/Pleura: Stable advanced emphysematous changes and areas of pulmonary scarring. The irregular right upper lobe density is stable. It measures a maximum of 31.5 x 24 mm on the sagittal sequence. 6.5 mm right upper lobe nodule on image number 54 is also stable. No new pulmonary lesions. No acute overlying pulmonary findings. No pleural effusion. Upper Abdomen: Stable large left renal cyst. Stable hepatic cysts. No adrenal gland lesions. Stable aortic calcifications. Musculoskeletal: No significant bony findings. IMPRESSION: 1. Stable irregular right upper lobe lesion and smaller right upper lobe pulmonary nodule. 2. No mediastinal or hilar mass or adenopathy. 3. Stable underline emphysematous changes and areas of pulmonary scarring. Electronically Signed   By: Rudie Meyer M.D.   On: 03/22/2016 09:03   Nm Pet Image Initial (pi) Skull Base To Thigh  01/10/2016  CLINICAL DATA:  Initial treatment strategy for 2 discrete right upper lobe pulmonary nodules. EXAM: NUCLEAR MEDICINE PET SKULL BASE TO THIGH TECHNIQUE: 13.0 mCi F-18 FDG was injected  intravenously. Full-ring PET imaging was performed from the skull base to thigh after the radiotracer. CT data was obtained and used for attenuation correction and anatomic localization. FASTING BLOOD GLUCOSE:  Value: 91 mg/dl COMPARISON:  CT scan 16/04/9603 FINDINGS: NECK No hypermetabolic lymph nodes in the neck. CHEST Spiculated posterior right upper lobe nodule is again identified. This lesion is the nodule measured at about 2 cm on the previous diagnostic CT and shows low level FDG accumulation with SUV max = 1.9. The second smaller pulmonary nodule is seen more caudally in the right upper lobe measuring up to about 8 mm. No detectable FDG uptake in this smaller nodule. No evidence for hypermetabolic metastatic disease in the mediastinum or hilar regions. Insert calcium heart Emphysema again noted in the lungs bilaterally. ABDOMEN/PELVIS No abnormal hypermetabolic activity within the liver, pancreas, adrenal glands, or spleen. No hypermetabolic lymph nodes in the abdomen or pelvis. There is abdominal aortic atherosclerosis without aneurysm. Abdominal aorta measures up to 2.8 cm in diameter. 7.0 cm exophytic water density lesion upper pole left kidney is compatible with a cyst in shows no hypermetabolism  on PET imaging. The colon does not show diffuse FDG mural uptake on today's study, but there is a single discrete nodular focus of FDG accumulation in the mid to distal sigmoid colon with SUV max = 5.3. This is in a background of diffuse left colonic diverticulosis. SKELETON No focal hypermetabolic activity to suggest skeletal metastasis. IMPRESSION: 1. The larger, more cranial spiculated pulmonary nodule in the right upper lobe shows low level FDG uptake. Well differentiated or low-grade neoplasm remains a concern. 2. 8 mm nodule also identified in the right upper lobe is without evidence of discernible FDG accumulation, but this may well be related to the small size of the nodule. 3. No evidence for  hypermetabolic metastatic disease in the chest, abdomen, or pelvis. 4. A single nodular focus of FDG accumulation in the colon is identified in the mid to distal sigmoid segment, in a region of fairly prominent diverticulosis. Given the focality of this uptake, a discrete area of colonic inflammation or colonic adenoma/carcinoma would be considerations. Correlation with colorectal cancer screening history recommended. 5. Abdominal aortic atherosclerosis. Electronically Signed   By: Kennith Center M.D.   On: 01/10/2016 14:24      Dg Chest 2 View  12/13/2015  CLINICAL DATA:  Followup lung nodule, patient smokes EXAM: CHEST  2 VIEW COMPARISON:  10/12/2015 FINDINGS: Irregular 12 mm right upper lobe nodule persists. Heart size and vascular pattern normal. Left lung is clear. No pleural effusions. IMPRESSION: Persistent irregular 12 mm upper lobe pulmonary nodule on the right. CT thorax preferably with contrast is suggested. These results will be called to the ordering clinician or representative by the Radiologist Assistant, and communication documented in the PACS or zVision Dashboard. Electronically Signed   By: Esperanza Heir M.D.   On: 12/13/2015 14:15   Ct Chest W Contrast  12/16/2015  CLINICAL DATA:  Lung nodule on recent chest x-ray EXAM: CT CHEST WITH CONTRAST TECHNIQUE: Multidetector CT imaging of the chest was performed during intravenous contrast administration. CONTRAST:  75mL ISOVUE-300 IOPAMIDOL (ISOVUE-300) INJECTION 61% COMPARISON:  12/13/2015 FINDINGS: The lungs are well aerated bilaterally. Diffuse emphysematous changes are identified. The left lung shows no evidence of focal infiltrate. The right lung demonstrates an 8 mm nodular density best seen on image number 69 of series 6 as well as a irregular spiculated lesion in the right upper lobe posteriorly which measures at least 2 cm in greatest dimension. Surrounding architectural distortion is noted in these changes are highly suspicious for  underlying neoplasm. The thoracic inlet is within normal limits. Aortic calcifications are seen without aneurysmal dilatation or dissection. The pulmonary artery shows no large central pulmonary embolus. Scattered small mediastinal lymph nodes are seen. A single 10 mm short axis lymph node is noted in the right hilum. The visualized upper abdomen demonstrates cystic changes of the left kidney and liver. A 9 mm short axis portacaval node is noted as well. The osseous structures show no acute abnormality. IMPRESSION: Multiple nodules within the right upper lobe. The largest of these measures at least 2 cm with significant architectural distortion and spiculation. These changes are consistent with pulmonary neoplasm till proven otherwise. Tissue sampling and further workup with a PET-CT may be helpful. These results will be called to the ordering clinician or representative by the Radiologist Assistant, and communication documented in the PACS or zVision Dashboard. Electronically Signed   By: Alcide Clever M.D.   On: 12/16/2015 16:37     I have independently reviewed the above radiology studies  and reviewed the findings with the patient.  Interpretation: The FVC, FEV1, FEV1/FVC ratio and FEF25-75% are reduced indicating airway obstruction. The FVC is reduced relative to the SVC indicating air trapping. . While the vital capacity and total lung capacity are within normal limits, the RV/TLC ratio is increased. Following administration of bronchodilators, there is a significant response indicated by the increased FVC. The reduced diffusing capacity indicates a moderate loss of functional alveolar capillary surface. However, the diffusing capacity was not corrected for the patient's hemoglobin. Pulmonary Function Diagnosis: Moderate Obstructive Airways Disease with reversibiility Restriction -Probable Moderate Diffusion Defect  FEV1= 2.34 60% DLCO 19.73 58%   Recent Lab Findings: Lab Results  Component  Value Date   WBC 9.9 03/29/2016   HGB 14.8 03/29/2016   HCT 44.0 03/29/2016   PLT 295 03/29/2016   GLUCOSE 110 (H) 03/29/2016   ALT 20 03/29/2016   AST 20 03/29/2016   NA 140 03/29/2016   K 4.2 03/29/2016   CL 105 03/29/2016   CREATININE 0.88 03/29/2016   BUN 14 03/29/2016   CO2 25 03/29/2016   INR 0.95 03/29/2016      Assessment / Plan:   1. The cranial spiculated pulmonary nodule in the right upper lobe shows low level FDG uptake. Well differentiated or low-grade neoplasm remains a concern, the second 8 mm nodule also identified in the right upper lobe is without evidence of discernible FDG accumulation.  There is No evidence for hypermetabolic metastatic disease in the chest, abdomen, or pelvis. I discussed in detail with the patient the possibility of low grade malignancy involving the right upper lobe we discussed proceeding  with surgical resection.  Stable irregular right upper lobe lesion and smaller right upper lobe pulmonary nodule.- I reviewed with patient the findings on the follow-up CT scan, the right upper lobe mass has not increased in size appreciably, but remains highly suspicious in a long-term smoker.- The pathology from navigation bronchoscopy to the right upper lobe lesion is benign  A single nodular focus of FDG accumulation in the colon is identified in the mid to distal sigmoid segment, in a region of fairly prominent diverticulosis. Given the focality of this uptake, a discrete area of colonic inflammation or colonic adenoma/carcinoma would be considerations. Correlation with colorectal cancer screening history recommended. Patient saw  GI  And procedure was done, he reports it was OK.   Moderate obstructive airway disease with reversibility moderate diffusion deficit- PFTs done 12/27/2015 Quantitferron TD gold - test done during his recent hospital time was negative.  Although the spiculated lesion in the right upper lobe has only marginally increased size  since August it is still suspicious for a malignancy and a long-term smoker. The patient is agreeable now to proceed with resection of the lesion, previous navigation bronchoscopy was benign. Wrist and options of surgery were discussed with the patient and his wife in detail. At this point he would like to proceed tentatively arrange for surgery on Monday, April 9.  The patient has been nonsmoking for the last 3 weeks.  Delight Ovens MD      [address] [place] [place] [address] Office [phone]   Beeper [phone]  10/25/2016 2:19 PM

## 2016-10-29 ENCOUNTER — Inpatient Hospital Stay (HOSPITAL_COMMUNITY): Payer: BLUE CROSS/BLUE SHIELD

## 2016-10-29 ENCOUNTER — Encounter (HOSPITAL_COMMUNITY)
Admission: RE | Disposition: A | Discharge status: Home / Self Care | Payer: Self-pay | Source: Ambulatory Visit | Attending: Cardiothoracic Surgery

## 2016-10-29 ENCOUNTER — Inpatient Hospital Stay (HOSPITAL_COMMUNITY): Payer: BLUE CROSS/BLUE SHIELD | Admitting: Anesthesiology

## 2016-10-29 ENCOUNTER — Encounter (HOSPITAL_COMMUNITY): Payer: Self-pay | Admitting: *Deleted

## 2016-10-29 ENCOUNTER — Inpatient Hospital Stay (HOSPITAL_COMMUNITY)
Admission: RE | Admit: 2016-10-29 | Discharge: 2016-11-02 | DRG: 164 | Disposition: A | Discharge status: Home / Self Care | Payer: BLUE CROSS/BLUE SHIELD | Source: Ambulatory Visit | Attending: Cardiothoracic Surgery | Admitting: Cardiothoracic Surgery

## 2016-10-29 DIAGNOSIS — J449 Chronic obstructive pulmonary disease, unspecified: Secondary | ICD-10-CM | POA: Diagnosis not present

## 2016-10-29 DIAGNOSIS — Z09 Encounter for follow-up examination after completed treatment for conditions other than malignant neoplasm: Secondary | ICD-10-CM

## 2016-10-29 DIAGNOSIS — K5790 Diverticulosis of intestine, part unspecified, without perforation or abscess without bleeding: Secondary | ICD-10-CM | POA: Diagnosis present

## 2016-10-29 DIAGNOSIS — K219 Gastro-esophageal reflux disease without esophagitis: Secondary | ICD-10-CM | POA: Diagnosis not present

## 2016-10-29 DIAGNOSIS — I10 Essential (primary) hypertension: Secondary | ICD-10-CM | POA: Diagnosis present

## 2016-10-29 DIAGNOSIS — Z4682 Encounter for fitting and adjustment of non-vascular catheter: Secondary | ICD-10-CM

## 2016-10-29 DIAGNOSIS — J850 Gangrene and necrosis of lung: Secondary | ICD-10-CM | POA: Diagnosis not present

## 2016-10-29 DIAGNOSIS — R911 Solitary pulmonary nodule: Secondary | ICD-10-CM | POA: Diagnosis not present

## 2016-10-29 DIAGNOSIS — R845 Abnormal microbiological findings in specimens from respiratory organs and thorax: Secondary | ICD-10-CM | POA: Diagnosis not present

## 2016-10-29 DIAGNOSIS — R222 Localized swelling, mass and lump, trunk: Secondary | ICD-10-CM | POA: Diagnosis not present

## 2016-10-29 DIAGNOSIS — Z87891 Personal history of nicotine dependence: Secondary | ICD-10-CM | POA: Diagnosis not present

## 2016-10-29 DIAGNOSIS — J42 Unspecified chronic bronchitis: Secondary | ICD-10-CM

## 2016-10-29 DIAGNOSIS — D62 Acute posthemorrhagic anemia: Secondary | ICD-10-CM | POA: Diagnosis not present

## 2016-10-29 DIAGNOSIS — A319 Mycobacterial infection, unspecified: Secondary | ICD-10-CM

## 2016-10-29 DIAGNOSIS — Z902 Acquired absence of lung [part of]: Secondary | ICD-10-CM | POA: Diagnosis not present

## 2016-10-29 DIAGNOSIS — Z885 Allergy status to narcotic agent status: Secondary | ICD-10-CM | POA: Diagnosis not present

## 2016-10-29 DIAGNOSIS — J939 Pneumothorax, unspecified: Secondary | ICD-10-CM

## 2016-10-29 DIAGNOSIS — J439 Emphysema, unspecified: Secondary | ICD-10-CM | POA: Diagnosis not present

## 2016-10-29 DIAGNOSIS — R05 Cough: Secondary | ICD-10-CM | POA: Diagnosis not present

## 2016-10-29 DIAGNOSIS — J9383 Other pneumothorax: Secondary | ICD-10-CM | POA: Diagnosis not present

## 2016-10-29 DIAGNOSIS — F17200 Nicotine dependence, unspecified, uncomplicated: Secondary | ICD-10-CM

## 2016-10-29 DIAGNOSIS — Z452 Encounter for adjustment and management of vascular access device: Secondary | ICD-10-CM | POA: Diagnosis not present

## 2016-10-29 HISTORY — PX: VIDEO ASSISTED THORACOSCOPY (VATS)/WEDGE RESECTION: SHX6174

## 2016-10-29 HISTORY — PX: VIDEO BRONCHOSCOPY: SHX5072

## 2016-10-29 LAB — GLUCOSE, CAPILLARY
Glucose-Capillary: 133 mg/dL — ABNORMAL HIGH (ref 65–99)
Glucose-Capillary: 150 mg/dL — ABNORMAL HIGH (ref 65–99)
Glucose-Capillary: 94 mg/dL (ref 65–99)

## 2016-10-29 SURGERY — VIDEO ASSISTED THORACOSCOPY (VATS)/WEDGE RESECTION
Anesthesia: General | Site: Chest | Laterality: Right

## 2016-10-29 MED ORDER — LIDOCAINE 2% (20 MG/ML) 5 ML SYRINGE
INTRAMUSCULAR | Status: AC
  Filled 2016-10-29: qty 5

## 2016-10-29 MED ORDER — ACETAMINOPHEN 500 MG PO TABS
1000.0000 mg | ORAL_TABLET | Freq: Four times a day (QID) | ORAL | Status: DC
Start: 2016-10-29 — End: 2016-11-02
  Administered 2016-10-29 – 2016-11-02 (×14): 1000 mg via ORAL
  Filled 2016-10-29 (×15): qty 2

## 2016-10-29 MED ORDER — OMEPRAZOLE MAGNESIUM 20 MG PO TBEC: 20.0000 mg | DELAYED_RELEASE_TABLET | Freq: Every day | ORAL | Status: DC

## 2016-10-29 MED ORDER — BISACODYL 5 MG PO TBEC
10.0000 mg | DELAYED_RELEASE_TABLET | Freq: Every day | ORAL | Status: DC
  Administered 2016-10-30 – 2016-11-02 (×4): 10 mg via ORAL
  Filled 2016-10-29 (×4): qty 2

## 2016-10-29 MED ORDER — ONDANSETRON HCL 4 MG/2ML IJ SOLN
INTRAMUSCULAR | Status: AC
  Filled 2016-10-29: qty 2

## 2016-10-29 MED ORDER — PANTOPRAZOLE SODIUM 40 MG PO TBEC
40.0000 mg | DELAYED_RELEASE_TABLET | Freq: Every day | ORAL | Status: DC
  Administered 2016-10-30 – 2016-11-02 (×4): 40 mg via ORAL
  Filled 2016-10-29 (×4): qty 1

## 2016-10-29 MED ORDER — FENTANYL CITRATE (PF) 100 MCG/2ML IJ SOLN
INTRAMUSCULAR | Status: AC
  Filled 2016-10-29: qty 2

## 2016-10-29 MED ORDER — ONDANSETRON HCL 4 MG/2ML IJ SOLN: 4.0000 mg | Freq: Once | INTRAMUSCULAR | Status: DC | PRN

## 2016-10-29 MED ORDER — FENTANYL 40 MCG/ML IV SOLN
INTRAVENOUS | Status: DC
  Administered 2016-10-29: 45 ug via INTRAVENOUS
  Administered 2016-10-29 – 2016-10-30 (×2): 1000 ug via INTRAVENOUS
  Administered 2016-10-30 (×2): 45 ug via INTRAVENOUS
  Administered 2016-10-30: 135 ug via INTRAVENOUS
  Administered 2016-10-30: 150 ug via INTRAVENOUS
  Administered 2016-10-31: 45 ug via INTRAVENOUS
  Administered 2016-10-31: 30 ug via INTRAVENOUS
  Administered 2016-10-31: 75 ug via INTRAVENOUS
  Administered 2016-10-31: 150 ug via INTRAVENOUS
  Administered 2016-10-31: 90 ug via INTRAVENOUS
  Administered 2016-11-01: 45 ug via INTRAVENOUS
  Administered 2016-11-01: 75 ug via INTRAVENOUS
  Filled 2016-10-29 (×2): qty 25

## 2016-10-29 MED ORDER — MIDAZOLAM HCL 2 MG/2ML IJ SOLN
INTRAMUSCULAR | Status: AC
  Filled 2016-10-29: qty 2

## 2016-10-29 MED ORDER — 0.9 % SODIUM CHLORIDE (POUR BTL) OPTIME
TOPICAL | Status: DC | PRN
  Administered 2016-10-29: 2000 mL

## 2016-10-29 MED ORDER — NALOXONE HCL 0.4 MG/ML IJ SOLN: 0.4000 mg | INTRAMUSCULAR | Status: DC | PRN

## 2016-10-29 MED ORDER — FENTANYL CITRATE (PF) 100 MCG/2ML IJ SOLN
25.0000 ug | INTRAMUSCULAR | Status: DC | PRN
  Administered 2016-10-29 (×2): 50 ug via INTRAVENOUS

## 2016-10-29 MED ORDER — ROCURONIUM BROMIDE 50 MG/5ML IV SOSY
PREFILLED_SYRINGE | INTRAVENOUS | Status: AC
  Filled 2016-10-29: qty 5

## 2016-10-29 MED ORDER — LEVALBUTEROL HCL 0.63 MG/3ML IN NEBU
0.6300 mg | INHALATION_SOLUTION | Freq: Four times a day (QID) | RESPIRATORY_TRACT | Status: DC
  Administered 2016-10-29 (×2): 0.63 mg via RESPIRATORY_TRACT
  Filled 2016-10-29 (×2): qty 3

## 2016-10-29 MED ORDER — FENTANYL CITRATE (PF) 100 MCG/2ML IJ SOLN
INTRAMUSCULAR | Status: DC | PRN
  Administered 2016-10-29 (×7): 50 ug via INTRAVENOUS
  Administered 2016-10-29: 100 ug via INTRAVENOUS
  Administered 2016-10-29 (×3): 50 ug via INTRAVENOUS

## 2016-10-29 MED ORDER — SUGAMMADEX SODIUM 200 MG/2ML IV SOLN
INTRAVENOUS | Status: DC | PRN
  Administered 2016-10-29: 150 mg via INTRAVENOUS

## 2016-10-29 MED ORDER — SUGAMMADEX SODIUM 200 MG/2ML IV SOLN
INTRAVENOUS | Status: AC
  Filled 2016-10-29: qty 2

## 2016-10-29 MED ORDER — PROPOFOL 10 MG/ML IV BOLUS
INTRAVENOUS | Status: AC
  Filled 2016-10-29: qty 20

## 2016-10-29 MED ORDER — SODIUM CHLORIDE 0.9 % IV SOLN
30.0000 meq | Freq: Every day | INTRAVENOUS | Status: DC | PRN
  Filled 2016-10-29: qty 15

## 2016-10-29 MED ORDER — DIPHENHYDRAMINE HCL 12.5 MG/5ML PO ELIX: 12.5000 mg | ORAL_SOLUTION | Freq: Four times a day (QID) | ORAL | Status: DC | PRN

## 2016-10-29 MED ORDER — BUPIVACAINE ON-Q PAIN PUMP (FOR ORDER SET NO CHG): INJECTION | Status: DC

## 2016-10-29 MED ORDER — BUPIVACAINE HCL (PF) 0.5 % IJ SOLN
INTRAMUSCULAR | Status: AC
  Filled 2016-10-29: qty 30

## 2016-10-29 MED ORDER — SODIUM CHLORIDE 0.9% FLUSH: 9.0000 mL | INTRAVENOUS | Status: DC | PRN

## 2016-10-29 MED ORDER — DEXTROSE 5 % IV SOLN
1.5000 g | INTRAVENOUS | Status: AC
  Administered 2016-10-29: 1.5 g via INTRAVENOUS
  Filled 2016-10-29: qty 1.5

## 2016-10-29 MED ORDER — DIPHENHYDRAMINE HCL 50 MG/ML IJ SOLN: 12.5000 mg | Freq: Four times a day (QID) | INTRAMUSCULAR | Status: DC | PRN

## 2016-10-29 MED ORDER — PROPOFOL 10 MG/ML IV BOLUS
INTRAVENOUS | Status: DC | PRN
  Administered 2016-10-29: 150 mg via INTRAVENOUS

## 2016-10-29 MED ORDER — FENTANYL CITRATE (PF) 250 MCG/5ML IJ SOLN
INTRAMUSCULAR | Status: AC
  Filled 2016-10-29: qty 5

## 2016-10-29 MED ORDER — ONDANSETRON HCL 4 MG/2ML IJ SOLN
4.0000 mg | Freq: Four times a day (QID) | INTRAMUSCULAR | Status: DC | PRN
  Filled 2016-10-29: qty 2

## 2016-10-29 MED ORDER — ONDANSETRON HCL 4 MG/2ML IJ SOLN
INTRAMUSCULAR | Status: DC | PRN
  Administered 2016-10-29: 4 mg via INTRAVENOUS

## 2016-10-29 MED ORDER — ACETAMINOPHEN 160 MG/5ML PO SOLN: 1000.0000 mg | Freq: Four times a day (QID) | ORAL | Status: DC

## 2016-10-29 MED ORDER — PHENYLEPHRINE 40 MCG/ML (10ML) SYRINGE FOR IV PUSH (FOR BLOOD PRESSURE SUPPORT)
PREFILLED_SYRINGE | INTRAVENOUS | Status: AC
  Filled 2016-10-29: qty 10

## 2016-10-29 MED ORDER — BUPIVACAINE 0.5 % ON-Q PUMP SINGLE CATH 400 ML
INJECTION | Status: DC | PRN
  Administered 2016-10-29: 400 mL

## 2016-10-29 MED ORDER — LACTATED RINGERS IV SOLN
INTRAVENOUS | Status: DC | PRN
  Administered 2016-10-29: 07:00:00 via INTRAVENOUS

## 2016-10-29 MED ORDER — BUPIVACAINE 0.5 % ON-Q PUMP SINGLE CATH 400 ML
400.0000 mL | INJECTION | Status: DC
  Filled 2016-10-29: qty 400

## 2016-10-29 MED ORDER — DEXTROSE-NACL 5-0.45 % IV SOLN
INTRAVENOUS | Status: DC
Start: 2016-10-29 — End: 2016-11-02

## 2016-10-29 MED ORDER — MIDAZOLAM HCL 5 MG/5ML IJ SOLN
INTRAMUSCULAR | Status: DC | PRN
  Administered 2016-10-29 (×2): 1 mg via INTRAVENOUS

## 2016-10-29 MED ORDER — SENNOSIDES-DOCUSATE SODIUM 8.6-50 MG PO TABS
1.0000 | ORAL_TABLET | Freq: Every day | ORAL | Status: DC
  Administered 2016-10-29 – 2016-11-01 (×4): 1 via ORAL
  Filled 2016-10-29 (×4): qty 1

## 2016-10-29 MED ORDER — ROCURONIUM BROMIDE 100 MG/10ML IV SOLN
INTRAVENOUS | Status: DC | PRN
  Administered 2016-10-29: 50 mg via INTRAVENOUS
  Administered 2016-10-29: 20 mg via INTRAVENOUS
  Administered 2016-10-29: 50 mg via INTRAVENOUS

## 2016-10-29 MED ORDER — INSULIN ASPART 100 UNIT/ML ~~LOC~~ SOLN
0.0000 [IU] | SUBCUTANEOUS | Status: DC
Start: 2016-10-29 — End: 2016-11-02
  Administered 2016-10-29 – 2016-11-02 (×10): 2 [IU] via SUBCUTANEOUS

## 2016-10-29 MED ORDER — BUPIVACAINE HCL 0.5 % IJ SOLN
INTRAMUSCULAR | Status: DC | PRN
  Administered 2016-10-29: 30 mL

## 2016-10-29 SURGICAL SUPPLY — 91 items
APPLICATOR TIP COSEAL (VASCULAR PRODUCTS) IMPLANT
APPLICATOR TIP EXT COSEAL (VASCULAR PRODUCTS) IMPLANT
BLADE SURG 11 STRL SS (BLADE) IMPLANT
BRUSH CYTOL CELLEBRITY 1.5X140 (MISCELLANEOUS) IMPLANT
CANISTER SUCT 3000ML PPV (MISCELLANEOUS) ×3 IMPLANT
CATH KIT ON Q 5IN SLV (PAIN MANAGEMENT) IMPLANT
CATH KIT ON-Q SILVERSOAK 5IN (CATHETERS) ×3 IMPLANT
CATH THORACIC 28FR (CATHETERS) ×3 IMPLANT
CATH THORACIC 36FR (CATHETERS) IMPLANT
CATH THORACIC 36FR RT ANG (CATHETERS) IMPLANT
CLIP TI MEDIUM 6 (CLIP) IMPLANT
CONN ST 1/4X3/8  BEN (MISCELLANEOUS) ×1
CONN ST 1/4X3/8 BEN (MISCELLANEOUS) ×2 IMPLANT
CONT SPEC 4OZ CLIKSEAL STRL BL (MISCELLANEOUS) ×9 IMPLANT
COVER BACK TABLE 60X90IN (DRAPES) IMPLANT
DERMABOND ADHESIVE PROPEN (GAUZE/BANDAGES/DRESSINGS) ×1
DERMABOND ADVANCED (GAUZE/BANDAGES/DRESSINGS) ×1
DERMABOND ADVANCED .7 DNX12 (GAUZE/BANDAGES/DRESSINGS) ×2 IMPLANT
DERMABOND ADVANCED .7 DNX6 (GAUZE/BANDAGES/DRESSINGS) ×2 IMPLANT
DRAIN CHANNEL 28F RND 3/8 FF (WOUND CARE) ×3 IMPLANT
DRAIN CHANNEL 32F RND 10.7 FF (WOUND CARE) IMPLANT
DRAPE LAPAROSCOPIC ABDOMINAL (DRAPES) ×3 IMPLANT
DRAPE SLUSH/WARMER DISC (DRAPES) ×3 IMPLANT
DRAPE WARM FLUID 44X44 (DRAPE) IMPLANT
DRILL BIT 7/64X5 (BIT) IMPLANT
ELECT BLADE 4.0 EZ CLEAN MEGAD (MISCELLANEOUS) ×6
ELECT BLADE 6.5 EXT (BLADE) ×3 IMPLANT
ELECT REM PT RETURN 9FT ADLT (ELECTROSURGICAL) ×3
ELECTRODE BLDE 4.0 EZ CLN MEGD (MISCELLANEOUS) ×4 IMPLANT
ELECTRODE REM PT RTRN 9FT ADLT (ELECTROSURGICAL) ×2 IMPLANT
FORCEPS BIOP RJ4 1.8 (CUTTING FORCEPS) IMPLANT
GAUZE SPONGE 4X4 12PLY STRL (GAUZE/BANDAGES/DRESSINGS) ×3 IMPLANT
GAUZE SPONGE 4X4 12PLY STRL LF (GAUZE/BANDAGES/DRESSINGS) ×3 IMPLANT
GLOVE BIO SURGEON STRL SZ 6.5 (GLOVE) ×6 IMPLANT
GOWN STRL REUS W/ TWL LRG LVL3 (GOWN DISPOSABLE) ×12 IMPLANT
GOWN STRL REUS W/TWL LRG LVL3 (GOWN DISPOSABLE) ×6
KIT BASIN OR (CUSTOM PROCEDURE TRAY) ×3 IMPLANT
KIT CLEAN ENDO COMPLIANCE (KITS) ×3 IMPLANT
KIT ROOM TURNOVER OR (KITS) ×3 IMPLANT
KIT SUCTION CATH 14FR (SUCTIONS) ×3 IMPLANT
MARKER SKIN DUAL TIP RULER LAB (MISCELLANEOUS) IMPLANT
NEEDLE BIOPSY TRANSBRONCH 21G (NEEDLE) IMPLANT
NS IRRIG 1000ML POUR BTL (IV SOLUTION) ×6 IMPLANT
OIL SILICONE PENTAX (PARTS (SERVICE/REPAIRS)) ×3 IMPLANT
PACK CHEST (CUSTOM PROCEDURE TRAY) ×3 IMPLANT
PAD ARMBOARD 7.5X6 YLW CONV (MISCELLANEOUS) ×9 IMPLANT
PASSER SUT SWANSON 36MM LOOP (INSTRUMENTS) ×3 IMPLANT
PENCIL BUTTON HOLSTER BLD 10FT (ELECTRODE) ×3 IMPLANT
SCISSORS LAP 5X35 DISP (ENDOMECHANICALS) IMPLANT
SEALANT SURG COSEAL 4ML (VASCULAR PRODUCTS) IMPLANT
SEALANT SURG COSEAL 8ML (VASCULAR PRODUCTS) IMPLANT
SOLUTION ANTI FOG 6CC (MISCELLANEOUS) ×3 IMPLANT
STAPLE RELOAD 45MM GOLD (STAPLE) ×18 IMPLANT
STAPLER ECHELON POWERED (MISCELLANEOUS) ×3 IMPLANT
SUT PROLENE 3 0 SH DA (SUTURE) IMPLANT
SUT PROLENE 4 0 RB 1 (SUTURE)
SUT PROLENE 4-0 RB1 .5 CRCL 36 (SUTURE) IMPLANT
SUT SILK  1 MH (SUTURE) ×5
SUT SILK 1 MH (SUTURE) ×10 IMPLANT
SUT SILK 1 TIES 10X30 (SUTURE) IMPLANT
SUT SILK 2 0 SH (SUTURE) IMPLANT
SUT SILK 2 0 SH CR/8 (SUTURE) ×3 IMPLANT
SUT SILK 2 0SH CR/8 30 (SUTURE) IMPLANT
SUT SILK 3 0SH CR/8 30 (SUTURE) IMPLANT
SUT STEEL 1 (SUTURE) IMPLANT
SUT VIC AB 0 CTX 18 (SUTURE) IMPLANT
SUT VIC AB 1 CTX 18 (SUTURE) ×3 IMPLANT
SUT VIC AB 1 CTX 36 (SUTURE)
SUT VIC AB 1 CTX36XBRD ANBCTR (SUTURE) IMPLANT
SUT VIC AB 2-0 CTX 36 (SUTURE) ×3 IMPLANT
SUT VIC AB 2-0 UR6 27 (SUTURE) IMPLANT
SUT VIC AB 3-0 SH 8-18 (SUTURE) IMPLANT
SUT VIC AB 3-0 X1 27 (SUTURE) IMPLANT
SUT VICRYL 0 UR6 27IN ABS (SUTURE) IMPLANT
SUT VICRYL 2 TP 1 (SUTURE) ×3 IMPLANT
SWAB COLLECTION DEVICE MRSA (MISCELLANEOUS) IMPLANT
SWAB CULTURE ESWAB REG 1ML (MISCELLANEOUS) IMPLANT
SYR 20ML ECCENTRIC (SYRINGE) IMPLANT
SYSTEM SAHARA CHEST DRAIN ATS (WOUND CARE) ×3 IMPLANT
TAPE CLOTH SURG 4X10 WHT LF (GAUZE/BANDAGES/DRESSINGS) ×3 IMPLANT
TAPE UMBILICAL COTTON 1/8X30 (MISCELLANEOUS) IMPLANT
TOWEL GREEN STERILE (TOWEL DISPOSABLE) ×3 IMPLANT
TOWEL GREEN STERILE FF (TOWEL DISPOSABLE) IMPLANT
TOWEL OR 17X24 6PK STRL BLUE (TOWEL DISPOSABLE) IMPLANT
TOWEL OR 17X26 10 PK STRL BLUE (TOWEL DISPOSABLE) IMPLANT
TRAP SPECIMEN MUCOUS 40CC (MISCELLANEOUS) ×3 IMPLANT
TRAY FOLEY W/METER SILVER 16FR (SET/KITS/TRAYS/PACK) ×3 IMPLANT
TROCAR BLADELESS 12MM (ENDOMECHANICALS) IMPLANT
TUBE CONNECTING 20X1/4 (TUBING) IMPLANT
TUNNELER SHEATH ON-Q 11GX8 DSP (PAIN MANAGEMENT) ×3 IMPLANT
WATER STERILE IRR 1000ML POUR (IV SOLUTION) ×6 IMPLANT

## 2016-10-29 NOTE — Anesthesia Procedure Notes (Signed)
Procedure Name: Intubation Date/Time: 10/29/2016 7:37 AM Performed by: Noreene Larsson, DAVID Pre-anesthesia Checklist: Patient identified, Emergency Drugs available, Suction available, Patient being monitored and Timeout performed Patient Re-evaluated:Patient Re-evaluated prior to inductionOxygen Delivery Method: Circle system utilized Preoxygenation: Pre-oxygenation with 100% oxygen Intubation Type: IV induction Ventilation: Mask ventilation without difficulty and Oral airway inserted - appropriate to patient size Laryngoscope Size: Miller and 2 Grade View: Grade I Tube type: Oral Tube size: 8.5 mm Number of attempts: 1 Airway Equipment and Method: Stylet and Oral airway Placement Confirmation: ETT inserted through vocal cords under direct vision,  positive ETCO2 and breath sounds checked- equal and bilateral Secured at: 23 cm Tube secured with: Tape Dental Injury: Teeth and Oropharynx as per pre-operative assessment  Comments: ETT inserted by Fabio Neighbors, SRNA, supervised by Dr. Noreene Larsson, MDA and Jefm Miles, CRNA

## 2016-10-29 NOTE — Transfer of Care (Cosign Needed)
Immediate Anesthesia Transfer of Care Note  Patient: ERRIC MACHNIK  Procedure(s) Performed: Procedure(s): VIDEO ASSISTED THORACOSCOPY (VATS)/Right upper lobe wedge RESECTION (Right) VIDEO BRONCHOSCOPY (N/A)  Patient Location: PACU  Anesthesia Type:General  Level of Consciousness: drowsy, patient cooperative and responds to stimulation  Airway & Oxygen Therapy: Patient Spontanous Breathing  Post-op Assessment: Report given to RN and Post -op Vital signs reviewed and stable  Post vital signs: Reviewed and stable  Last Vitals:  Vitals:   10/29/16 0722 10/29/16 0723  BP:    Pulse: 81 82  Resp: 16   Temp:      Last Pain:  Vitals:   10/29/16 0606  TempSrc: Oral      Patients Stated Pain Goal: 2 (10/29/16 0558)  Complications: No apparent anesthesia complications

## 2016-10-29 NOTE — Anesthesia Preprocedure Evaluation (Signed)
Anesthesia Evaluation  Patient identified by MRN, date of birth, ID band Patient awake    Reviewed: Allergy & Precautions, NPO status , Patient's Chart, lab work & pertinent test results  Airway Mallampati: II  TM Distance: >3 FB Neck ROM: Full    Dental  (+) Edentulous Upper, Upper Dentures, Dental Advisory Given   Pulmonary COPD, former smoker,    breath sounds clear to auscultation + decreased breath sounds      Cardiovascular hypertension, Pt. on medications  Rhythm:Regular Rate:Normal     Neuro/Psych  Neuromuscular disease    GI/Hepatic GERD  Medicated and Controlled,  Endo/Other    Renal/GU      Musculoskeletal   Abdominal   Peds  Hematology   Anesthesia Other Findings   Reproductive/Obstetrics                             Anesthesia Physical Anesthesia Plan Anesthesia Quick Evaluation

## 2016-10-29 NOTE — Anesthesia Procedure Notes (Signed)
Procedure Name: Intubation Date/Time: 10/29/2016 7:54 AM Performed by: Noreene Larsson, DAVID Pre-anesthesia Checklist: Patient identified, Emergency Drugs available, Suction available and Patient being monitored Patient Re-evaluated:Patient Re-evaluated prior to inductionOxygen Delivery Method: Circle system utilized Preoxygenation: Pre-oxygenation with 100% oxygen Laryngoscope Size: Miller and 2 Grade View: Grade I Tube type: Oral Endobronchial tube: Double lumen EBT and 39 Fr Number of attempts: 2 Airway Equipment and Method: Stylet Placement Confirmation: ETT inserted through vocal cords under direct vision,  positive ETCO2 and breath sounds checked- equal and bilateral Secured at: 31 cm Tube secured with: Tape Dental Injury: Teeth and Oropharynx as per pre-operative assessment  Comments: Tube exchanged to double lumen under supervision from Dr. Noreene Larsson, MDA and Jefm Miles, CRNA

## 2016-10-29 NOTE — H&P (Signed)
[address]       [place] [address]             [phone]                    [name] [facility] Medical Record #[id number] Date of Birth: 1960-07-25  Referring: No ref. provider found Primary Care: Maryelizabeth Rowan, MD  Chief Complaint:    No chief complaint on file.   History of Present Illness:    [name] Ross 56 y.o. male is seen in the office June 2017   for Abnormal chest x-ray and CT scan. The patient had a chest x-ray in March 207  of this year demonstrating a right upper lobe lung nodule. He was seen in urgent care because of cough and pulmonary complaints. Follow-up chest x-ray done in the primary care office in late May 2017  showed persistent of right upper lobe lung lesion, this is confirmed on CT scan. The patient is a long-term at least a pack a day for more than 30 years. Since previously seen he has significantly cut down on his smoking but not stopped.  He does have environmental/work exposure to dust but is not aware of any specific asbestos exposure. He denies any previous cardiac history, denies tuberculosis exposure.  The patient did agree to proceeding with the navigation bronchoscopy, which was 03/2016 . Final path on this was benign.     Current Activity/ Functional Status:  Patient is independent with mobility/ambulation, transfers, ADL's, IADL's.   Zubrod Score: At the time of surgery this patient's most appropriate activity status/level should be described as: [x]     0    Normal activity, no symptoms []     1    Restricted in physical strenuous activity but ambulatory, able to do out light work []     2    Ambulatory and capable of self care, unable to do work activities, up and about               >50 % of waking hours                              []     3    Only limited self care, in bed greater than 50% of waking hours []     4    Completely disabled, no self care, confined to bed or chair []     5     Moribund   Past Medical History:  Diagnosis Date  . Back pain   . Chronic bronchitis (HCC)   . COPD (chronic obstructive pulmonary disease) (HCC)   . GERD (gastroesophageal reflux disease)   . Multiple lung nodules   . Neuromuscular disorder (HCC)    leg- pain causing a reduced stamina for walking , due to leg pain.   . Tobacco abuse     Past Surgical History:  Procedure Laterality Date  . APPENDECTOMY  1980  . BACK SURGERY  1980's  . VIDEO BRONCHOSCOPY WITH ENDOBRONCHIAL NAVIGATION N/A 04/02/2016   Procedure: VIDEO BRONCHOSCOPY WITH ENDOBRONCHIAL NAVIGATION;  Surgeon: Delight Ovens, MD;  Location: [facility] OR;  Service: Thoracic;  Laterality: N/A;    History reviewed. No pertinent family history.  Social History   Social History  . Marital status: Single    Spouse name: N/A  . Number of children: N/A  . Years of education: N/A  Occupational History  . Not on file.   Social History Main Topics  . Smoking status: Former Smoker    Packs/day: 1.50    Years: 30.00  . Smokeless tobacco: Never Used  . Alcohol use Yes     Comment: daily- beer, 3-4 /day   . Drug use: No  . Sexual activity: Not on file   Other Topics Concern  . Not on file   Social History Narrative  . No narrative on file    History  Smoking Status  . Former Smoker  . Packs/day: 1.50  . Years: 30.00  Smokeless Tobacco  . Never Used    History  Alcohol Use  . Yes    Comment: daily- beer, 3-4 /day      Allergies  Allergen Reactions  . Codeine Hives  . Demerol [Meperidine] Hives    Current Facility-Administered Medications  Medication Dose Route Frequency Provider Last Rate Last Dose  . 0.9 % irrigation (POUR BTL)    PRN Delight Ovens, MD   2,000 mL at 10/29/16 0707  . cefUROXime (ZINACEF) 1.5 g in dextrose 5 % 50 mL IVPB  1.5 g Intravenous 60 min Pre-Op Delight Ovens, MD       Facility-Administered Medications Ordered in Other Encounters  Medication Dose Route Frequency  Provider Last Rate Last Dose  . fentaNYL (SUBLIMAZE) injection    Anesthesia Intra-op De Nurse, CRNA   50 mcg at 10/29/16 0650  . lactated ringers infusion    Continuous PRN De Nurse, CRNA      . lactated ringers infusion    Continuous PRN De Nurse, CRNA      . midazolam (VERSED) 5 MG/5ML injection    Anesthesia Intra-op De Nurse, CRNA   1 mg at 10/29/16 1610      Review of Systems:     Cardiac Review of Systems: Y or N  Chest Pain [  n  ]  Resting SOB [ n  ] Exertional SOB  [ n ]  Orthopnea [ n ]   Pedal Edema [n   ]    Palpitations [n  ] Syncope  [ n ]   Presyncope [   n]  General Review of Systems: [Y] = yes [  ]=no Constitional: recent weight change [  n];  Wt loss over the last 3 months [   ] anorexia [  ]; fatigue [  ]; nausea [  ]; night sweats [  ]; fever [  ]; or chills [  ];          Dental: poor dentition[  ]; Last Dentist visit: dentures  Eye : blurred vision [  ]; diplopia [   ]; vision changes [  ];  Amaurosis fugax[  ]; Resp: cough [  ];  wheezing[  ];  hemoptysis[  ]; shortness of breath[  ]; paroxysmal nocturnal dyspnea[  ]; dyspnea on exertion[  ]; or orthopnea[  ];  GI:  gallstones[  ], vomiting[  ];  dysphagia[  ]; melena[  ];  hematochezia [  ]; heartburn[  ];   Hx of  Colonoscopy[  ]; GU: kidney stones [  ]; hematuria[  ];   dysuria [  ];  nocturia[  ];  history of     obstruction [  ]; urinary frequency [  ]             Skin: rash, swelling[  ];, hair loss[  ];  peripheral edema[  ];  or itching[  ]; Musculosketetal: myalgias[  ];  joint swelling[  ];  joint erythema[  ];  joint pain[  ];  back pain[  ];  Heme/Lymph: bruising[  ];  bleeding[  ];  anemia[  ];  Neuro: TIA[  ];  headaches[  ];  stroke[  ];  vertigo[  ];  seizures[  ];   paresthesias[  ];  difficulty walking[n  ];  Psych:depression[  ]; anxiety[  ];  Endocrine: diabetes[ n ];  thyroid dysfunction[n  ];  Immunizations: Flu up to date [?  ]; Pneumococcal up to date [ ?  ];  Other:  Physical Exam: BP 137/75   Pulse 85   Temp 98.2 F (36.8 C) (Oral)   Resp 20   Wt 162 lb (73.5 kg)   SpO2 96%   BMI 22.59 kg/m   PHYSICAL EXAMINATION: General appearance: alert, cooperative, appears stated age and no distress Head: Normocephalic, without obvious abnormality, atraumatic Neck: no adenopathy, no carotid bruit, no JVD, supple, symmetrical, trachea midline and thyroid not enlarged, symmetric, no tenderness/mass/nodules Lymph nodes: Cervical, supraclavicular, and axillary nodes normal. Resp: clear to auscultation bilaterally Back: symmetric, no curvature. ROM normal. No CVA tenderness. Cardio: regular rate and rhythm, S1, S2 normal, no murmur, click, rub or gallop GI: soft, non-tender; bowel sounds normal; no masses,  no organomegaly Extremities: extremities normal, atraumatic, no cyanosis or edema and Homans sign is negative, no sign of DVT Neurologic: Grossly normal  Diagnostic Studies & Laboratory data:     Recent Radiology Findings: Dg Chest 2 View  Result Date: 10/29/2016 CLINICAL DATA:  56 y/o M; right lung nodule preoperative evaluation. EXAM: CHEST  2 VIEW COMPARISON:  10/25/2016 chest CT. FINDINGS: Normal cardiac silhouette. Right upper lobe pulmonary nodule projects over the right posterior fifth rib. Emphysema. No consolidation, pneumothorax, or pleural effusion. Bones are unremarkable. IMPRESSION: No acute pulmonary process. Emphysema. Right upper lobe pulmonary nodule projects over right posterior fifth rib. Electronically Signed   By: Mitzi Hansen M.D.   On: 10/29/2016 06:11   Ct Chest Wo Contrast  Result Date: 08/30/2016 CLINICAL DATA:  Pulmonary nodules EXAM: CT CHEST WITHOUT CONTRAST TECHNIQUE: Multidetector CT imaging of the chest was performed following the standard protocol without IV contrast. COMPARISON:  Chest radiograph March 22, 2016 and PET-CT January 10, 2016 FINDINGS: Cardiovascular: There is no appreciable thoracic aortic  aneurysm. There is mild calcification in the visualized great vessels. Visualized great vessels otherwise appear unremarkable on this noncontrast enhanced study. There are scattered foci of calcification throughout the aorta. There are scattered foci of coronary artery calcification. Pericardium is not appreciably thickened. Mediastinum/Nodes: There is a stable benign-appearing calcification in the left lobe of the thyroid. Thyroid otherwise appears unremarkable. There is no appreciable thoracic adenopathy. There are stable subcentimeter mediastinal lymph nodes present. There is a small hiatal hernia. Lungs/Pleura: There is a degree of underlying centrilobular emphysematous change. There is scarring in the medial apex on the left, stable. There is a somewhat spiculated nodular opacity in the posterior segment of the right upper lobe measuring 3.0 x 2.4 cm, best appreciated on sagittal slice 44 series 602 and directly compared with that same image on prior study. This lesion appears stable compared to most recent prior study. There is a nodular opacity more inferiorly in the right upper lobe posterior segment measuring 6 x 6 mm, unchanged. It is best appreciated on axial slice 57 series 4. There is a new nodular opacity in  the anterior segment of the right upper lobe best seen on axial slice 63 series 4. There is a new 3 mm nodular opacity in the posterior segment of the left upper lobe seen on axial slice 45 series 4. There is a new 3 mm nodular opacity in the anterior segment of the left upper lobe medially seen on axial slice 46 series 4. There is a new 3 mm nodular opacity in the anterior segment of the left upper lobe on axial slice 49 series 4. There is a new 4 mm nodular opacity with a tiny central cavitation in the posterior segment of the left upper lobe seen on axial slice 51 series 4 and sagittal slice 119 series 602. Note new larger lesions are evident. No edema or consolidation. There is no appreciable  pleural effusion or pleural thickening. Upper Abdomen: There is atherosclerotic calcification in the visualized abdominal aorta. There is a cyst arising from the posterior upper to mid left kidney measuring 6.1 x 4.9 cm. Visualized upper abdominal structures are otherwise unremarkable. Musculoskeletal: There are no blastic or lytic bone lesions. IMPRESSION: The dominant spiculated lesion in the right upper lobe remain stable. There is a 6 mm nodular lesion in the posterior segment right upper lobe which is likewise stable. There are several new 3-4 mm nodular opacities as summarized above. One of these lesions contains a tiny central area of cavitation. Given this change, a followup study in 3 months to assess for stability may well be warranted. No evident adenopathy by size criteria. Areas of atherosclerotic change. Scattered areas of coronary artery calcification noted. Electronically Signed   By: Bretta Bang III M.D.   On: 08/30/2016 15:09   Ct Super D Chest Wo Contrast  Result Date: 03/22/2016 CLINICAL DATA:  Preop for right lung biopsy. EXAM: CT CHEST WITHOUT CONTRAST TECHNIQUE: Multidetector CT imaging of the chest was performed using thin slice collimation for electromagnetic bronchoscopy planning purposes, without intravenous contrast. COMPARISON:  Chest CT 12/16/2015 and PET-CT 01/10/2016 FINDINGS: Chest wall: No chest wall mass, supraclavicular or axillary lymphadenopathy. The thyroid gland is normal. Cardiovascular: The heart is normal in size. No pericardial effusion. Stable tortuosity and calcification of the thoracic aorta. Stable coronary artery calcifications. Mediastinum/Nodes: No new mediastinal or hilar mass or adenopathy. Small scattered lymph nodes are stable. These were not metabolically active on the PET-CT. The esophagus is grossly normal. Lungs/Pleura: Stable advanced emphysematous changes and areas of pulmonary scarring. The irregular right upper lobe density is stable. It  measures a maximum of 31.5 x 24 mm on the sagittal sequence. 6.5 mm right upper lobe nodule on image number 54 is also stable. No new pulmonary lesions. No acute overlying pulmonary findings. No pleural effusion. Upper Abdomen: Stable large left renal cyst. Stable hepatic cysts. No adrenal gland lesions. Stable aortic calcifications. Musculoskeletal: No significant bony findings. IMPRESSION: 1. Stable irregular right upper lobe lesion and smaller right upper lobe pulmonary nodule. 2. No mediastinal or hilar mass or adenopathy. 3. Stable underline emphysematous changes and areas of pulmonary scarring. Electronically Signed   By: Rudie Meyer M.D.   On: 03/22/2016 09:03   Nm Pet Image Initial (pi) Skull Base To Thigh  01/10/2016  CLINICAL DATA:  Initial treatment strategy for 2 discrete right upper lobe pulmonary nodules. EXAM: NUCLEAR MEDICINE PET SKULL BASE TO THIGH TECHNIQUE: 13.0 mCi F-18 FDG was injected intravenously. Full-ring PET imaging was performed from the skull base to thigh after the radiotracer. CT data was obtained and used for  attenuation correction and anatomic localization. FASTING BLOOD GLUCOSE:  Value: 91 mg/dl COMPARISON:  CT scan 16/04/9603 FINDINGS: NECK No hypermetabolic lymph nodes in the neck. CHEST Spiculated posterior right upper lobe nodule is again identified. This lesion is the nodule measured at about 2 cm on the previous diagnostic CT and shows low level FDG accumulation with SUV max = 1.9. The second smaller pulmonary nodule is seen more caudally in the right upper lobe measuring up to about 8 mm. No detectable FDG uptake in this smaller nodule. No evidence for hypermetabolic metastatic disease in the mediastinum or hilar regions. Insert calcium heart Emphysema again noted in the lungs bilaterally. ABDOMEN/PELVIS No abnormal hypermetabolic activity within the liver, pancreas, adrenal glands, or spleen. No hypermetabolic lymph nodes in the abdomen or pelvis. There is abdominal  aortic atherosclerosis without aneurysm. Abdominal aorta measures up to 2.8 cm in diameter. 7.0 cm exophytic water density lesion upper pole left kidney is compatible with a cyst in shows no hypermetabolism on PET imaging. The colon does not show diffuse FDG mural uptake on today's study, but there is a single discrete nodular focus of FDG accumulation in the mid to distal sigmoid colon with SUV max = 5.3. This is in a background of diffuse left colonic diverticulosis. SKELETON No focal hypermetabolic activity to suggest skeletal metastasis. IMPRESSION: 1. The larger, more cranial spiculated pulmonary nodule in the right upper lobe shows low level FDG uptake. Well differentiated or low-grade neoplasm remains a concern. 2. 8 mm nodule also identified in the right upper lobe is without evidence of discernible FDG accumulation, but this may well be related to the small size of the nodule. 3. No evidence for hypermetabolic metastatic disease in the chest, abdomen, or pelvis. 4. A single nodular focus of FDG accumulation in the colon is identified in the mid to distal sigmoid segment, in a region of fairly prominent diverticulosis. Given the focality of this uptake, a discrete area of colonic inflammation or colonic adenoma/carcinoma would be considerations. Correlation with colorectal cancer screening history recommended. 5. Abdominal aortic atherosclerosis. Electronically Signed   By: Kennith Center M.D.   On: 01/10/2016 14:24      Dg Chest 2 View  12/13/2015  CLINICAL DATA:  Followup lung nodule, patient smokes EXAM: CHEST  2 VIEW COMPARISON:  10/12/2015 FINDINGS: Irregular 12 mm right upper lobe nodule persists. Heart size and vascular pattern normal. Left lung is clear. No pleural effusions. IMPRESSION: Persistent irregular 12 mm upper lobe pulmonary nodule on the right. CT thorax preferably with contrast is suggested. These results will be called to the ordering clinician or representative by the Radiologist  Assistant, and communication documented in the PACS or zVision Dashboard. Electronically Signed   By: Esperanza Heir M.D.   On: 12/13/2015 14:15   Ct Chest W Contrast  12/16/2015  CLINICAL DATA:  Lung nodule on recent chest x-ray EXAM: CT CHEST WITH CONTRAST TECHNIQUE: Multidetector CT imaging of the chest was performed during intravenous contrast administration. CONTRAST:  75mL ISOVUE-300 IOPAMIDOL (ISOVUE-300) INJECTION 61% COMPARISON:  12/13/2015 FINDINGS: The lungs are well aerated bilaterally. Diffuse emphysematous changes are identified. The left lung shows no evidence of focal infiltrate. The right lung demonstrates an 8 mm nodular density best seen on image number 69 of series 6 as well as a irregular spiculated lesion in the right upper lobe posteriorly which measures at least 2 cm in greatest dimension. Surrounding architectural distortion is noted in these changes are highly suspicious for underlying neoplasm. The thoracic  inlet is within normal limits. Aortic calcifications are seen without aneurysmal dilatation or dissection. The pulmonary artery shows no large central pulmonary embolus. Scattered small mediastinal lymph nodes are seen. A single 10 mm short axis lymph node is noted in the right hilum. The visualized upper abdomen demonstrates cystic changes of the left kidney and liver. A 9 mm short axis portacaval node is noted as well. The osseous structures show no acute abnormality. IMPRESSION: Multiple nodules within the right upper lobe. The largest of these measures at least 2 cm with significant architectural distortion and spiculation. These changes are consistent with pulmonary neoplasm till proven otherwise. Tissue sampling and further workup with a PET-CT may be helpful. These results will be called to the ordering clinician or representative by the Radiologist Assistant, and communication documented in the PACS or zVision Dashboard. Electronically Signed   By: Alcide Clever M.D.   On:  12/16/2015 16:37     I have independently reviewed the above radiology studies  and reviewed the findings with the patient.  Interpretation: The FVC, FEV1, FEV1/FVC ratio and FEF25-75% are reduced indicating airway obstruction. The FVC is reduced relative to the SVC indicating air trapping. . While the vital capacity and total lung capacity are within normal limits, the RV/TLC ratio is increased. Following administration of bronchodilators, there is a significant response indicated by the increased FVC. The reduced diffusing capacity indicates a moderate loss of functional alveolar capillary surface. However, the diffusing capacity was not corrected for the patient's hemoglobin. Pulmonary Function Diagnosis: Moderate Obstructive Airways Disease with reversibiility Restriction -Probable Moderate Diffusion Defect  FEV1= 2.34 60% DLCO 19.73 58%   Recent Lab Findings: Lab Results  Component Value Date   WBC 7.9 10/25/2016   HGB 12.0 (L) 10/25/2016   HCT 35.0 (L) 10/25/2016   PLT 305 10/25/2016   GLUCOSE 82 10/25/2016   ALT 15 (L) 10/25/2016   AST 16 10/25/2016   NA 130 (L) 10/25/2016   K 4.2 10/25/2016   CL 99 (L) 10/25/2016   CREATININE 0.98 10/25/2016   BUN 18 10/25/2016   CO2 20 (L) 10/25/2016   INR 0.96 10/25/2016      Assessment / Plan:   1. The cranial spiculated pulmonary nodule in the right upper lobe shows low level FDG uptake. Well differentiated or low-grade neoplasm remains a concern, the second 8 mm nodule also identified in the right upper lobe is without evidence of discernible FDG accumulation.  There is No evidence for hypermetabolic metastatic disease in the chest, abdomen, or pelvis. I discussed in detail with the patient the possibility of low grade malignancy involving the right upper lobe we discussed proceeding  with surgical resection.  Stable irregular right upper lobe lesion and smaller right upper lobe pulmonary nodule.- I reviewed with patient the  findings on the follow-up CT scan, the right upper lobe mass has not increased in size appreciably, but remains highly suspicious in a long-term smoker.- The pathology from navigation bronchoscopy to the right upper lobe lesion is benign  A single nodular focus of FDG accumulation in the colon is identified in the mid to distal sigmoid segment, in a region of fairly prominent diverticulosis. Given the focality of this uptake, a discrete area of colonic inflammation or colonic adenoma/carcinoma would be considerations. Correlation with colorectal cancer screening history recommended. Patient saw  GI  And procedure was done, he reports it was OK.   Moderate obstructive airway disease with reversibility moderate diffusion deficit- PFTs done 12/27/2015 Quantitferron TD  gold - test done during his recent hospital time was negative.  Although the spiculated lesion in the right upper lobe has only marginally increased size since August it is still suspicious for a malignancy and a long-term smoker. The patient is agreeable now to proceed with resection of the lesion, previous navigation bronchoscopy was benign. Wrist and options of surgery were discussed with the patient and his wife in detail.  The patient has been nonsmoking for the last 3 weeks.  The goals risks and alternatives of the planned surgical procedure Procedure(s): VIDEO ASSISTED THORACOSCOPY (VATS)/LUNG RESECTION (Right) VIDEO BRONCHOSCOPY (N/A)  have been discussed with the patient in detail. The risks of the procedure including death, infection, stroke, myocardial infarction, bleeding, blood transfusion have all been discussed specifically.  I have quoted Irwin Brakeman a 2 % of perioperative mortality and a complication rate as high as 40 %. The patient's questions have been answered.RAEKWAN SPELMAN is willing  to proceed with the planned procedure.  Delight Ovens MD      [address] [place] [place] [address] Office  [phone]   Beeper [phone]  10/29/2016 7:19 AM

## 2016-10-29 NOTE — Care Management Note (Signed)
Case Management Note  Patient Details  Name: [name] Ross MRN: [id number] Date of Birth: 1960-09-09  Subjective/Objective:   s/p VATS lung resection today, conts on fentanyl pca, chest tubes to suction, iv abx.                Action/Plan: NCM will cont to follow patient's progression.   Expected Discharge Date:                  Expected Discharge Plan:  Home w Home Health Services  In-House Referral:     Discharge planning Services  CM Consult  Post Acute Care Choice:    Choice offered to:     DME Arranged:    DME Agency:     HH Arranged:    HH Agency:     Status of Service:  In process, will continue to follow  If discussed at Long Length of Stay Meetings, dates discussed:    Additional Comments:  Leone Haven, RN 10/29/2016, 2:50 PM

## 2016-10-29 NOTE — Brief Op Note (Addendum)
10/29/2016  10:11 AM  PATIENT:  [name] Ross  56 y.o. male  PRE-OPERATIVE DIAGNOSIS:  right lung nodule  POST-OPERATIVE DIAGNOSIS:  right lung nodule  PROCEDURE:  Procedure(s): VIDEO ASSISTED THORACOSCOPY (VATS)/LUNG RESECTION (Right) VIDEO BRONCHOSCOPY (N/A)  LIKELY AN INFECTIOUS NODULE. CULTURES PENDING  SURGEON:  Surgeon(s) and Role:    * Delight Ovens, MD - Primary  PHYSICIAN ASSISTANT:  Jari Favre, PA-C    ANESTHESIA:   general  EBL:  Total I/O In: 1000 [I.V.:1000] Out: 365 [Urine:365]  BLOOD ADMINISTERED:none  DRAINS: TWO CHEST TUBES   LOCAL MEDICATIONS USED:  OTHER ON-Q  SPECIMEN:  Source of Specimen:  right upper lobe wedge resection  DISPOSITION OF SPECIMEN:  PATHOLOGY  COUNTS:  YES   DICTATION: .Dragon Dictation  PLAN OF CARE: Admit to inpatient   PATIENT DISPOSITION:  ICU - intubated and hemodynamically stable.   Delay start of Pharmacological VTE agent (>24hrs) due to surgical blood loss or risk of bleeding: yes

## 2016-10-29 NOTE — Anesthesia Postprocedure Evaluation (Addendum)
Anesthesia Post Note  Patient: KASH DAVIE  Procedure(s) Performed: Procedure(s) (LRB): VIDEO ASSISTED THORACOSCOPY (VATS)/Right upper lobe wedge RESECTION (Right) VIDEO BRONCHOSCOPY (N/A)  Patient location during evaluation: PACU Anesthesia Type: General Level of consciousness: awake, awake and alert and oriented Pain management: pain level controlled Vital Signs Assessment: post-procedure vital signs reviewed and stable Respiratory status: spontaneous breathing, nonlabored ventilation and respiratory function stable Cardiovascular status: blood pressure returned to baseline Anesthetic complications: no       Last Vitals:  Vitals:   10/29/16 1700 10/29/16 1800  BP:  110/63  Pulse: 87 74  Resp: (!) 21 18  Temp:      Last Pain:  Vitals:   10/29/16 1600  TempSrc:   PainSc: 3                  Nazair Fortenberry COKER

## 2016-10-29 NOTE — Anesthesia Procedure Notes (Addendum)
Central Venous Catheter Insertion Performed by: Pearson Reasons, anesthesiologist Patient location: Pre-op. Preanesthetic checklist: patient identified, IV checked, site marked, risks and benefits discussed, surgical consent, monitors and equipment checked, pre-op evaluation, timeout performed and anesthesia consent Position: Trendelenburg Lidocaine 1% used for infiltration and patient sedated Hand hygiene performed , maximum sterile barriers used  and Seldinger technique used Catheter size: 8 Fr Total catheter length 16. Central line was placed.Double lumen Procedure performed using ultrasound guided technique. Ultrasound Notes:anatomy identified, needle tip was noted to be adjacent to the nerve/plexus identified, no ultrasound evidence of intravascular and/or intraneural injection and image(s) printed for medical record Attempts: 1 Following insertion, dressing applied, line sutured and Biopatch. Post procedure assessment: blood return through all ports, free fluid flow and no air  Patient tolerated the procedure well with no immediate complications.          

## 2016-10-29 NOTE — Anesthesia Preprocedure Evaluation (Addendum)
Anesthesia Evaluation  Patient identified by MRN, date of birth, ID band Patient awake    Reviewed: Allergy & Precautions, NPO status , Patient's Chart, lab work & pertinent test results  Airway Mallampati: II  TM Distance: >3 FB Neck ROM: Full    Dental  (+) Edentulous Upper, Edentulous Lower   Pulmonary former smoker,    breath sounds clear to auscultation       Cardiovascular  Rhythm:Regular Rate:Normal     Neuro/Psych    GI/Hepatic   Endo/Other    Renal/GU      Musculoskeletal   Abdominal   Peds  Hematology   Anesthesia Other Findings   Reproductive/Obstetrics                            Anesthesia Physical Anesthesia Plan  ASA: III  Anesthesia Plan: General   Post-op Pain Management:    Induction: Intravenous  Airway Management Planned: Double Lumen EBT  Additional Equipment: Arterial line and CVP  Intra-op Plan:   Post-operative Plan: Extubation in OR  Informed Consent: I have reviewed the patients History and Physical, chart, labs and discussed the procedure including the risks, benefits and alternatives for the proposed anesthesia with the patient or authorized representative who has indicated his/her understanding and acceptance.     Plan Discussed with: CRNA and Anesthesiologist  Anesthesia Plan Comments:         Anesthesia Quick Evaluation

## 2016-10-30 ENCOUNTER — Inpatient Hospital Stay (HOSPITAL_COMMUNITY): Payer: BLUE CROSS/BLUE SHIELD

## 2016-10-30 ENCOUNTER — Encounter (HOSPITAL_COMMUNITY): Payer: Self-pay | Admitting: Cardiothoracic Surgery

## 2016-10-30 DIAGNOSIS — R845 Abnormal microbiological findings in specimens from respiratory organs and thorax: Secondary | ICD-10-CM

## 2016-10-30 DIAGNOSIS — R911 Solitary pulmonary nodule: Principal | ICD-10-CM

## 2016-10-30 DIAGNOSIS — Z885 Allergy status to narcotic agent status: Secondary | ICD-10-CM

## 2016-10-30 DIAGNOSIS — Z87891 Personal history of nicotine dependence: Secondary | ICD-10-CM

## 2016-10-30 DIAGNOSIS — A319 Mycobacterial infection, unspecified: Secondary | ICD-10-CM

## 2016-10-30 DIAGNOSIS — Z902 Acquired absence of lung [part of]: Secondary | ICD-10-CM

## 2016-10-30 DIAGNOSIS — J42 Unspecified chronic bronchitis: Secondary | ICD-10-CM

## 2016-10-30 DIAGNOSIS — F17200 Nicotine dependence, unspecified, uncomplicated: Secondary | ICD-10-CM

## 2016-10-30 LAB — BLOOD GAS, ARTERIAL
ACID-BASE EXCESS: 3.6 mmol/L — AB (ref 0.0–2.0)
Bicarbonate: 27.9 mmol/L (ref 20.0–28.0)
DRAWN BY: 290171
FIO2: 24
O2 Content: 1 L/min
O2 SAT: 96.4 %
PATIENT TEMPERATURE: 98.6
PH ART: 7.41 (ref 7.350–7.450)
pCO2 arterial: 44.9 mmHg (ref 32.0–48.0)
pO2, Arterial: 81.6 mmHg — ABNORMAL LOW (ref 83.0–108.0)

## 2016-10-30 LAB — BASIC METABOLIC PANEL
ANION GAP: 9 (ref 5–15)
BUN: 8 mg/dL (ref 6–20)
CO2: 26 mmol/L (ref 22–32)
Calcium: 8.6 mg/dL — ABNORMAL LOW (ref 8.9–10.3)
Chloride: 99 mmol/L — ABNORMAL LOW (ref 101–111)
Creatinine, Ser: 0.71 mg/dL (ref 0.61–1.24)
Glucose, Bld: 127 mg/dL — ABNORMAL HIGH (ref 65–99)
Potassium: 3.5 mmol/L (ref 3.5–5.1)
Sodium: 134 mmol/L — ABNORMAL LOW (ref 135–145)

## 2016-10-30 LAB — ACID FAST SMEAR (AFB, MYCOBACTERIA): Acid Fast Smear: POSITIVE

## 2016-10-30 LAB — CBC
HEMATOCRIT: 32.6 % — AB (ref 39.0–52.0)
HEMOGLOBIN: 10.7 g/dL — AB (ref 13.0–17.0)
MCH: 30.1 pg (ref 26.0–34.0)
MCHC: 32.8 g/dL (ref 30.0–36.0)
MCV: 91.8 fL (ref 78.0–100.0)
Platelets: 286 10*3/uL (ref 150–400)
RBC: 3.55 MIL/uL — AB (ref 4.22–5.81)
RDW: 12.5 % (ref 11.5–15.5)
WBC: 10.6 10*3/uL — ABNORMAL HIGH (ref 4.0–10.5)

## 2016-10-30 LAB — GLUCOSE, CAPILLARY
Glucose-Capillary: 114 mg/dL — ABNORMAL HIGH (ref 65–99)
Glucose-Capillary: 121 mg/dL — ABNORMAL HIGH (ref 65–99)
Glucose-Capillary: 125 mg/dL — ABNORMAL HIGH (ref 65–99)
Glucose-Capillary: 128 mg/dL — ABNORMAL HIGH (ref 65–99)
Glucose-Capillary: 139 mg/dL — ABNORMAL HIGH (ref 65–99)
Glucose-Capillary: 140 mg/dL — ABNORMAL HIGH (ref 65–99)

## 2016-10-30 MED ORDER — LEVALBUTEROL HCL 0.63 MG/3ML IN NEBU
0.6300 mg | INHALATION_SOLUTION | Freq: Three times a day (TID) | RESPIRATORY_TRACT | Status: DC
  Administered 2016-10-30: 0.63 mg via RESPIRATORY_TRACT
  Filled 2016-10-30: qty 3

## 2016-10-30 MED ORDER — LOSARTAN POTASSIUM-HCTZ 100-25 MG PO TABS: 1.0000 | ORAL_TABLET | Freq: Every day | ORAL | Status: DC

## 2016-10-30 MED ORDER — LOSARTAN POTASSIUM 50 MG PO TABS
50.0000 mg | ORAL_TABLET | Freq: Once | ORAL | Status: AC
  Administered 2016-10-30: 50 mg via ORAL
  Filled 2016-10-30: qty 1

## 2016-10-30 MED ORDER — POTASSIUM CHLORIDE 20 MEQ/15ML (10%) PO SOLN: 20.0000 meq | Freq: Four times a day (QID) | ORAL | Status: AC

## 2016-10-30 MED ORDER — CYCLOBENZAPRINE HCL 10 MG PO TABS
5.0000 mg | ORAL_TABLET | Freq: Three times a day (TID) | ORAL | Status: DC | PRN
  Administered 2016-10-30 – 2016-11-02 (×5): 5 mg via ORAL
  Filled 2016-10-30 (×5): qty 1

## 2016-10-30 MED ORDER — LOSARTAN POTASSIUM 50 MG PO TABS
100.0000 mg | ORAL_TABLET | Freq: Every day | ORAL | Status: DC
  Administered 2016-10-30 – 2016-11-02 (×4): 100 mg via ORAL
  Filled 2016-10-30 (×4): qty 2

## 2016-10-30 MED ORDER — HYDROCHLOROTHIAZIDE 25 MG PO TABS
25.0000 mg | ORAL_TABLET | Freq: Every day | ORAL | Status: DC
  Administered 2016-10-30 – 2016-11-02 (×4): 25 mg via ORAL
  Filled 2016-10-30 (×4): qty 1

## 2016-10-30 MED ORDER — LEVALBUTEROL HCL 0.63 MG/3ML IN NEBU: 0.6300 mg | INHALATION_SOLUTION | RESPIRATORY_TRACT | Status: DC | PRN

## 2016-10-30 MED ORDER — POTASSIUM CHLORIDE CRYS ER 20 MEQ PO TBCR
20.0000 meq | EXTENDED_RELEASE_TABLET | Freq: Four times a day (QID) | ORAL | Status: AC
  Administered 2016-10-30 (×2): 20 meq via ORAL
  Filled 2016-10-30 (×2): qty 1

## 2016-10-30 MED ORDER — ENOXAPARIN SODIUM 40 MG/0.4ML ~~LOC~~ SOLN
40.0000 mg | SUBCUTANEOUS | Status: DC
  Administered 2016-10-30 – 2016-11-01 (×3): 40 mg via SUBCUTANEOUS
  Filled 2016-10-30 (×4): qty 0.4

## 2016-10-30 NOTE — Progress Notes (Signed)
   10/30/16 0104  Respiratory Severity Assessment  Respiratory History 2  Breath Sounds 1  Respiratory Pattern 0  Cough 0  Chest X Ray 1 (partial lobectomy)  O2 Requirements 1  Level of Activity 1  Dyspnea 1  Score Total 7   RT changed patient's Xopenex breathing treatments from Q6 to TID per protocol assessment.

## 2016-10-30 NOTE — Op Note (Signed)
NAME:  [name] Ross, [name] Ross                   ACCOUNT NO.:  MEDICAL RECORD NO.:  [id number]  LOCATION:                                 FACILITY:  PHYSICIAN:  Sheliah Plane, MD         DATE OF BIRTH:  DATE OF PROCEDURE:  10/29/2016 DATE OF DISCHARGE:                              OPERATIVE REPORT   PREOPERATIVE DIAGNOSIS:  Suspicious right upper lobe lung lesion.  POSTOPERATIVE DIAGNOSIS:  Suspicious right upper lobe lung lesion. Inflammatory nodule by frozen section.  PROCEDURE PERFORMED:  Bronchoscopy, right video-assisted thoracoscopy, mini thoracotomy, wedge resection of right upper lobe, and placement of On-Q.  SURGEON:  Sheliah Plane, MD.  FIRST ASSISTANT:  Jari Favre, PA.  BRIEF HISTORY:  The patient is a 56 year old male with long smoking history.  The patient presented with an abnormal chest x-ray with a suspicious lesion in the right upper lobe.  Subsequent serial CT scans had been performed, PET scan and navigation bronchoscopy to this site in August 2017.  Because of the patient's high risk for malignancy with his long smoking history and suspicious features of the mass, resection was recommended.  The patient delayed this for personal reasons. Ultimately, a repeat CT scan showed that persistence of the lesion, although the navigation bronchoscopy was negative and the mass was only very mildly hypermetabolic.  Its morphologic features were highly suggestive of malignancy and the patient agreed to removal and signed informed consent.  DESCRIPTION OF PROCEDURE:  The patient underwent general endotracheal anesthesia without incident, appropriate time-out was performed, and through the endotracheal tube, a fiberoptic bronchoscope was passed to the subsegmental level of both right and left tracheobronchial tree without evidence of endobronchial lesions.  Scope was removed.  Double- lumen endotracheal tube was placed by Dr. Noreene Larsson.  The patient was then turned in left  lateral decubitus position with the right side up, which had preoperatively been marked.  A second time-out was performed.  The right chest was prepped with Betadine and draped in usual sterile manner.  Approximately fifth intercostal space, small incision was made entering the right space with a 30-degree video scope.  The lung was not fully deflated, but it was apparent, there were adhesions anteriorly and superiorly holding the upper lobe.  The incision was enlarged slightly. The scope removed through a lower anterior port incision.  Through this, we were able to take down the adhesions of the upper lobe, although the lesion was not readily available visible on the surface of the lung. Through the small incision, we were able to palpate the right upper lobe lung mass approximately 1.5 cm in size.  Using a 45 Ethicon stapler, the lesion was excised with a wedge resection of the right upper lobe and submitted to Pathology.  Margins were negative and the mass itself was identified and appeared to be an inflammatory nodule.  Appropriate cultures will be obtained too.  We attempted to place an On-Q subpleurally, but had difficulty tunnelling.  The On-Q was then left along the rib and the intercostal space.  Chest tubes were secured.  The lung inflated well.  The incision was closed with drilled lower rib  and 2 pericostal sutures.  The muscle layer was closed with interrupted 0 Vicryl, running 3-0 Vicryl, and subcutaneous tissue and 3-0 subcuticular stitch in skin edges.  Dermabond was applied.  At the completion the procedure, the patient had no air leak.  He was then weaned and extubated in the operating room and transferred to the recovery room for further postoperative observation.  Sponge and needle count was reported as correct at completion of procedure.  The patient tolerated the procedure without obvious complication.  Blood loss was minimal, less than 100 mL.     Sheliah Plane, MD     EG/MEDQ  D:  10/30/2016  T:  10/30/2016  Job:  [id number]

## 2016-10-30 NOTE — Consult Note (Signed)
Date of Admission:  10/29/2016  Date of Consult:  10/30/2016  Reason for Consult: AFB + lung nodule Referring Physician: Dr. Servando Snare   HPI: [name] Ross is an 56 y.o. male with extensive smoking history recently diagnosed COPD who is been found to have an abnormal chest x-ray and CT scan in June 2017. It showed a right upper lobe nodule. He had been seen in urgent care because of cough and pulmonary complaints. Follow-up chest x-ray done in primary care office in late May 2017 at shown persistence of his right upper lobe lung lesion. Again CT scan confirmed. In the interim he ultimately underwent rhonchi. In September 2017 all cultures taken from bronchoscopy with BAL were negative including fungal cultures AFB stain and culture and bacterial cultures and pathology was not showing evidence of malignancy. Due to concern about persistence of his pulmonary nodule the patient was ultimately brought to the hospital for elective video-assisted thoracoscopic surgery. He states that he has not been coughing much except for approximate 3 weeks ago when he had stopped his smoking. He denies any history of fevers or chills or night sweats. He is had a serum quantify urine goal check before which was negative during this workup of his pulmonary nodule. He was incarcerated more than 20 years ago and spent a year in jail. He was not exposed to any known TB contacts and states that he has never traveled outside the country.  His lung nodule was resected yesterday and the stain for acid-fast bacilli has been reported as being positive. I've asked the [facility] try to send this for MTB M avium and non-tubercle is mcybobacterai probe by DNA.   Past Medical History:  Diagnosis Date  . Back pain   . Chronic bronchitis ([place])   . COPD (chronic obstructive pulmonary disease) ([place])   . GERD (gastroesophageal reflux disease)   . Multiple lung nodules   . Neuromuscular disorder ([facility])    leg- pain  causing a reduced stamina for walking , due to leg pain.   . Tobacco abuse     Past Surgical History:  Procedure Laterality Date  . APPENDECTOMY  1980  . BACK SURGERY  1980's  . VIDEO ASSISTED THORACOSCOPY (VATS)/WEDGE RESECTION Right 10/29/2016   Procedure: VIDEO ASSISTED THORACOSCOPY (VATS)/Right upper lobe wedge RESECTION;  Surgeon: Grace Isaac, MD;  Location: [facility];  Service: Thoracic;  Laterality: Right;  Marland Kitchen VIDEO BRONCHOSCOPY N/A 10/29/2016   Procedure: VIDEO BRONCHOSCOPY;  Surgeon: Grace Isaac, MD;  Location: [facility] OR;  Service: Thoracic;  Laterality: N/A;  . VIDEO BRONCHOSCOPY WITH ENDOBRONCHIAL NAVIGATION N/A 04/02/2016   Procedure: VIDEO BRONCHOSCOPY WITH ENDOBRONCHIAL NAVIGATION;  Surgeon: Grace Isaac, MD;  Location: [facility];  Service: Thoracic;  Laterality: N/A;    Social History:  reports that he has quit smoking. He has a 45.00 pack-year smoking history. He has never used smokeless tobacco. He reports that he drinks alcohol. He reports that he does not use drugs.   History reviewed. No pertinent family history.  Allergies  Allergen Reactions  . Codeine Hives  . Demerol [Meperidine] Hives     Medications: I have reviewed patients current medications as documented in Epic Anti-infectives    Start     Dose/Rate Route Frequency Ordered Stop   10/29/16 0547  cefUROXime (ZINACEF) 1.5 g in dextrose 5 % 50 mL IVPB     1.5 g 100 mL/hr over 30 Minutes Intravenous 60 min pre-op 10/29/16  8592 10/29/16 0738         ROS: as in HPI otherwise remainder of 12 point Review of Systems is negative  Blood pressure (!) 152/87, pulse 72, temperature 98.1 F (36.7 C), temperature source Oral, resp. rate (!) 22, height _0  (1.803 m), weight 174 lb 6.1 oz (79.1 kg), SpO2 96 %. General: Alert and awake, oriented x3, not in any acute distress. HEENT: anicteric sclera,  EOMI, oropharynx clear and without exudate, IJ in place Cardiovascular: regular rate, normal r,  no murmur  rubs or gallops Pulmonary:diminished bs at bases, no respiratory distress, chest tube with serosanguineous material  Gastrointestinal: soft nontender, nondistended, normal bowel sounds, Musculoskeletal: no  clubbing or edema noted bilaterally Skin, soft tissue: no rashes Neuro: nonfocal, strength and sensation intact   Results for orders placed or performed during the hospital encounter of 10/29/16 (from the past 48 hour(s))  Aerobic/Anaerobic Culture (surgical/deep wound)     Status: None (Preliminary result)   Collection Time: 10/29/16  9:58 AM  Result Value Ref Range   Specimen Description TISSUE RIGHT LUNG    Special Requests NONE    Gram Stain      RARE WBC PRESENT, PREDOMINANTLY PMN NO ORGANISMS SEEN    Culture PENDING    Report Status PENDING   Acid Fast Smear (AFB)     Status: None   Collection Time: 10/29/16  9:58 AM  Result Value Ref Range   AFB Specimen Processing Concentration    Acid Fast Smear Positive     Comment: CRITICAL RESULT CALLED TO, READ BACK BY AND VERIFIED WITH: Beckie Salts RN, AT [phone] 10/30/16 BY D. VANHOOK (NOTE) 1+, 4-36 acid-fast bacilli per 100 fields at 400X magnification, fluorescent smear REPORTED TO DORQUITA V. 10/30/16 @ 7:16AM SB FAX# [phone] Performed At: [facility] [place], [place] [id number] Lindon Romp MD FX:[id number]    Source (AFB) TISSUE     Comment: RIGHT LUNG   Glucose, capillary     Status: Abnormal   Collection Time: 10/29/16  6:34 PM  Result Value Ref Range   Glucose-Capillary 133 (H) 65 - 99 mg/dL  Glucose, capillary     Status: Abnormal   Collection Time: 10/29/16  7:35 PM  Result Value Ref Range   Glucose-Capillary 150 (H) 65 - 99 mg/dL   Comment 1 Capillary Specimen   Glucose, capillary     Status: None   Collection Time: 10/29/16 11:14 PM  Result Value Ref Range   Glucose-Capillary 94 65 - 99 mg/dL   Comment 1 Capillary Specimen   CBC     Status: Abnormal   Collection Time:  10/30/16  3:20 AM  Result Value Ref Range   WBC 10.6 (H) 4.0 - 10.5 K/uL   RBC 3.55 (L) 4.22 - 5.81 MIL/uL   Hemoglobin 10.7 (L) 13.0 - 17.0 g/dL   HCT 32.6 (L) 39.0 - 52.0 %   MCV 91.8 78.0 - 100.0 fL   MCH 30.1 26.0 - 34.0 pg   MCHC 32.8 30.0 - 36.0 g/dL   RDW 12.5 11.5 - 15.5 %   Platelets 286 150 - 400 K/uL  Basic metabolic panel     Status: Abnormal   Collection Time: 10/30/16  3:20 AM  Result Value Ref Range   Sodium 134 (L) 135 - 145 mmol/L   Potassium 3.5 3.5 - 5.1 mmol/L   Chloride 99 (L) 101 - 111 mmol/L   CO2 26 22 - 32 mmol/L   Glucose, Bld  127 (H) 65 - 99 mg/dL   BUN 8 6 - 20 mg/dL   Creatinine, Ser 0.71 0.61 - 1.24 mg/dL   Calcium 8.6 (L) 8.9 - 10.3 mg/dL   GFR calc non Af Amer >60 >60 mL/min   GFR calc Af Amer >60 >60 mL/min    Comment: (NOTE) The eGFR has been calculated using the CKD EPI equation. This calculation has not been validated in all clinical situations. eGFR's persistently <60 mL/min signify possible Chronic Kidney Disease.    Anion gap 9 5 - 15  Glucose, capillary     Status: Abnormal   Collection Time: 10/30/16  3:26 AM  Result Value Ref Range   Glucose-Capillary 121 (H) 65 - 99 mg/dL   Comment 1 Capillary Specimen   Blood gas, arterial     Status: Abnormal   Collection Time: 10/30/16  3:47 AM  Result Value Ref Range   FIO2 24.00    O2 Content 1.0 L/min   Delivery systems NASAL CANNULA    pH, Arterial 7.410 7.350 - 7.450   pCO2 arterial 44.9 32.0 - 48.0 mmHg   pO2, Arterial 81.6 (L) 83.0 - 108.0 mmHg   Bicarbonate 27.9 20.0 - 28.0 mmol/L   Acid-Base Excess 3.6 (H) 0.0 - 2.0 mmol/L   O2 Saturation 96.4 %   Patient temperature 98.6    Collection site A-LINE    Drawn by [phone]    Sample type ARTERIAL DRAW   Glucose, capillary     Status: Abnormal   Collection Time: 10/30/16  7:22 AM  Result Value Ref Range   Glucose-Capillary 139 (H) 65 - 99 mg/dL   Comment 1 Capillary Specimen   Glucose, capillary     Status: Abnormal   Collection  Time: 10/30/16 12:39 PM  Result Value Ref Range   Glucose-Capillary 114 (H) 65 - 99 mg/dL   _0 (sdes,specrequest,cult,reptstatus)   ) Recent Results (from the past 720 hour(s))  Surgical pcr screen     Status: None   Collection Time: 10/25/16  4:05 PM  Result Value Ref Range Status   MRSA, PCR NEGATIVE NEGATIVE Final   Staphylococcus aureus NEGATIVE NEGATIVE Final    Comment:        The Xpert SA Assay (FDA approved for NASAL specimens in patients over 21 years of age), is one component of a comprehensive surveillance program.  Test performance has been validated by [facility] for patients greater than or equal to 39 year old. It is not intended to diagnose infection nor to guide or monitor treatment.   Aerobic/Anaerobic Culture (surgical/deep wound)     Status: None (Preliminary result)   Collection Time: 10/29/16  9:58 AM  Result Value Ref Range Status   Specimen Description TISSUE RIGHT LUNG  Final   Special Requests NONE  Final   Gram Stain   Final    RARE WBC PRESENT, PREDOMINANTLY PMN NO ORGANISMS SEEN    Culture PENDING  Incomplete   Report Status PENDING  Incomplete  Acid Fast Smear (AFB)     Status: None   Collection Time: 10/29/16  9:58 AM  Result Value Ref Range Status   AFB Specimen Processing Concentration  Final   Acid Fast Smear Positive  Final    Comment: CRITICAL RESULT CALLED TO, READ BACK BY AND VERIFIED WITH: Beckie Salts RN, AT [phone] 10/30/16 BY D. VANHOOK (NOTE) 1+, 4-36 acid-fast bacilli per 100 fields at 400X magnification, fluorescent smear REPORTED TO DORQUITA V. 10/30/16 @ 7:16AM SB FAX# [phone] Performed  At: [facility] [place], [place] [id number] Lindon Romp MD XY:[id number]    Source (AFB) TISSUE  Final    Comment: RIGHT LUNG      Impression/Recommendation  Active Problems:   S/P partial lobectomy of lung   [name] Ross is a 56 y.o. male with upper lob nodule with AFB +  smear  #1 AFB positive lung nodule: Obviously the concer is for possible MTB  --hopefully we can get some answers with MTB, M avium, NTM PCR probe --if not I would recommend ultimately starting him on [place] and pluggin him in with GHD until we can get data on cultures --if this is as NTM he may not require much therapy anymore because he does not seem to have much other evidence of infection besides this solitary nodule. --He eeds an HIV test which I will order.    10/30/2016, 1:08 PM   Thank you so much for this interesting consult  [place] for [place] [phone] (pager) [phone] (office) 10/30/2016, 1:08 PM  Rhina [place] 10/30/2016, 1:08 PM

## 2016-10-30 NOTE — Progress Notes (Addendum)
[address]       [name] Ross [address]             [phone]      1 Day Post-Op Procedure(s) (LRB): VIDEO ASSISTED THORACOSCOPY (VATS)/Right upper lobe wedge RESECTION (Right) VIDEO BRONCHOSCOPY (N/A) Subjective: Feels pretty well , minor pain  Objective: Vital signs in last 24 hours: Temp:  [97 F (36.1 C)-98.7 F (37.1 C)] 97.6 F (36.4 C) (04/10 0730) Pulse Rate:  [56-93] 65 (04/10 0600) Cardiac Rhythm: Normal sinus rhythm (04/10 0600) Resp:  [12-28] 15 (04/10 0800) BP: (109-152)/(56-81) 115/65 (04/10 0700) SpO2:  [91 %-100 %] 98 % (04/10 0842) Arterial Line BP: (120-207)/(46-85) 120/60 (04/10 0700)  Hemodynamic parameters for last 24 hours:    Intake/Output from previous day: 04/09 0701 - 04/10 0700 In: 3090 [P.O.:240; I.V.:2850] Out: 2415 [Urine:2125; Blood:100; Chest Tube:190] Intake/Output this shift: No intake/output data recorded.  General appearance: alert, cooperative and no distress Heart: regular rate and rhythm Lungs: clear to auscultation bilaterally Abdomen: benign Extremities: no edema or calf tenderness Wound: dressings intact  Lab Results:  Recent Labs  10/30/16 0320  WBC 10.6*  HGB 10.7*  HCT 32.6*  PLT 286   BMET:  Recent Labs  10/30/16 0320  NA 134*  K 3.5  CL 99*  CO2 26  GLUCOSE 127*  BUN 8  CREATININE 0.71  CALCIUM 8.6*    PT/INR: No results for input(s): LABPROT, INR in the last 72 hours. ABG    Component Value Date/Time   PHART 7.410 10/30/2016 0347   HCO3 27.9 10/30/2016 0347   ACIDBASEDEF 2.6 (H) 10/25/2016 1509   O2SAT 96.4 10/30/2016 0347   CBG (last 3)   Recent Labs  10/29/16 2314 10/30/16 0326 10/30/16 0722  GLUCAP 94 121* 139*    Meds Scheduled Meds: . acetaminophen  1,000 mg Oral Q6H   Or  . acetaminophen (TYLENOL) oral liquid 160 mg/5 mL  1,000 mg Oral Q6H  . bisacodyl  10 mg Oral Daily  . fentaNYL   Intravenous Q4H  . insulin aspart  0-24 Units Subcutaneous Q4H  .  levalbuterol  0.63 mg Nebulization TID  . pantoprazole  40 mg Oral Daily  . potassium chloride  20 mEq Oral Q6H   Or  . potassium chloride  20 mEq Oral Q6H  . senna-docusate  1 tablet Oral QHS   Continuous Infusions: . bupivacaine ON-Q pain pump    . dextrose 5 % and 0.45% NaCl 100 mL (10/29/16 1430)   PRN Meds:.diphenhydrAMINE **OR** diphenhydrAMINE, naloxone **AND** sodium chloride flush, ondansetron (ZOFRAN) IV, potassium chloride (KCL MULTIRUN) 30 mEq in 265 mL IVPB  Xrays Dg Chest 2 View  Result Date: 10/29/2016 CLINICAL DATA:  56 y/o M; right lung nodule preoperative evaluation. EXAM: CHEST  2 VIEW COMPARISON:  10/25/2016 chest CT. FINDINGS: Normal cardiac silhouette. Right upper lobe pulmonary nodule projects over the right posterior fifth rib. Emphysema. No consolidation, pneumothorax, or pleural effusion. Bones are unremarkable. IMPRESSION: No acute pulmonary process. Emphysema. Right upper lobe pulmonary nodule projects over right posterior fifth rib. Electronically Signed   By: Mitzi Hansen M.D.   On: 10/29/2016 06:11   Dg Chest Port 1 View  Result Date: 10/30/2016 CLINICAL DATA:  Status post partial lobectomy on the right.  COPD EXAM: PORTABLE CHEST 1 VIEW COMPARISON:  Portable chest x-ray of October 29, 2016 FINDINGS: The small right apical pneumothorax has decreased in size and amounts to less than 5% of the  lung volume. The 2 right-sided chest tubes are in stable position. The interstitial markings of the right lung are coarse especially in the upper lobe inter stable. The left lung is clear. There is no pleural effusion. The heart is normal in size. There is calcification in the wall of the aortic arch. The trachea is midline. The right internal jugular venous catheter tip projects over the midportion of the SVC. IMPRESSION: Less than 5% right apical pneumothorax. This has decreased in volume since the earlier study. The chest tubes are in stable position. Persistent mild  interstitial prominence in the upper portion of the right lung following VATS. Thoracic aortic atherosclerosis. Electronically Signed   By: David  [place] M.D.   On: 10/30/2016 07:21   Dg Chest Port 1 View  Result Date: 10/29/2016 CLINICAL DATA:  56 year old male post VATS.  Subsequent encounter. EXAM: PORTABLE CHEST 1 VIEW COMPARISON:  10/29/2016. FINDINGS: Post right sided VATS with surgical clips right upper thorax. Right-sided chest tube is in place. 5% right apical pneumothorax. Pulmonary vascular prominence residual right upper lung. Right central line tip distal superior vena cava level. Heart size within normal limits. Mildly tortuous aorta. IMPRESSION: Post right sided VATS with surgical clips right upper thorax. Right-sided chest tube is in place. 5% right apical pneumothorax. Pulmonary vascular prominence residual right upper lung. Right central line tip distal superior vena cava level. Electronically Signed   By: Lacy Duverney M.D.   On: 10/29/2016 11:33   Results for orders placed or performed during the hospital encounter of 10/29/16  Aerobic/Anaerobic Culture (surgical/deep wound)     Status: None (Preliminary result)   Collection Time: 10/29/16  9:58 AM  Result Value Ref Range Status   Specimen Description TISSUE RIGHT LUNG  Final   Special Requests NONE  Final   Gram Stain   Final    RARE WBC PRESENT, PREDOMINANTLY PMN NO ORGANISMS SEEN    Culture PENDING  Incomplete   Report Status PENDING  Incomplete  Acid Fast Smear (AFB)     Status: None   Collection Time: 10/29/16  9:58 AM  Result Value Ref Range Status   AFB Specimen Processing Concentration  Final   Acid Fast Smear Positive  Final    Comment: CRITICAL RESULT CALLED TO, READ BACK BY AND VERIFIED WITH: Rosette Reveal RN, AT [phone] 10/30/16 BY D. VANHOOK (NOTE) 1+, 4-36 acid-fast bacilli per 100 fields at 400X magnification, fluorescent smear REPORTED TO DORQUITA V. 10/30/16 @ 7:16AM SB FAX# [phone] Performed At: [facility] [address] [place], [place] [id number] Mila Homer MD AO:[id number]    Source (AFB) TISSUE  Final    Comment: RIGHT LUNG    Assessment/Plan: S/P Procedure(s) (LRB): VIDEO ASSISTED THORACOSCOPY (VATS)/Right upper lobe wedge RESECTION (Right) VIDEO BRONCHOSCOPY (N/A)  1 doing well from surgical perspective 2 d/c a line and foley 3 no air leak , 130 cc out yesterday from chest tube- will place to H2O seal today 4 reduce IVF rate 5 mild ABL anemia 6 lytes/renal fxn ok- K+ being replaced 7 culture + AFB, tx to neg pressure room with TB precautions 8 restart Hyzaar for HTN 9 IS/pulm toilet, advance activities as able  LOS: 1 day    [name] Ross,[name] Ross 10/30/2016  Id consult requested  Ct to water seal I have seen and examined [name] Ross and agree with the above assessment  and plan.  Delight Ovens MD [place] [phone] Office [phone] 10/30/2016 1:26 PM

## 2016-10-31 LAB — GLUCOSE, CAPILLARY
Glucose-Capillary: 125 mg/dL — ABNORMAL HIGH (ref 65–99)
Glucose-Capillary: 107 mg/dL — ABNORMAL HIGH (ref 65–99)
Glucose-Capillary: 131 mg/dL — ABNORMAL HIGH (ref 65–99)
Glucose-Capillary: 111 mg/dL — ABNORMAL HIGH (ref 65–99)
Glucose-Capillary: 110 mg/dL — ABNORMAL HIGH (ref 65–99)
Glucose-Capillary: 127 mg/dL — ABNORMAL HIGH (ref 65–99)

## 2016-10-31 LAB — CBC
HCT: 32.5 % — ABNORMAL LOW (ref 39.0–52.0)
HEMOGLOBIN: 10.7 g/dL — AB (ref 13.0–17.0)
MCH: 31 pg (ref 26.0–34.0)
MCHC: 32.9 g/dL (ref 30.0–36.0)
MCV: 94.2 fL (ref 78.0–100.0)
Platelets: 322 10*3/uL (ref 150–400)
RBC: 3.45 MIL/uL — ABNORMAL LOW (ref 4.22–5.81)
RDW: 12.8 % (ref 11.5–15.5)
WBC: 10.3 10*3/uL (ref 4.0–10.5)

## 2016-10-31 LAB — COMPREHENSIVE METABOLIC PANEL
ALK PHOS: 67 U/L (ref 38–126)
ALT: 15 U/L — ABNORMAL LOW (ref 17–63)
ANION GAP: 9 (ref 5–15)
AST: 20 U/L (ref 15–41)
Albumin: 2.8 g/dL — ABNORMAL LOW (ref 3.5–5.0)
BUN: 8 mg/dL (ref 6–20)
CO2: 29 mmol/L (ref 22–32)
CREATININE: 0.88 mg/dL (ref 0.61–1.24)
Calcium: 8.8 mg/dL — ABNORMAL LOW (ref 8.9–10.3)
Chloride: 96 mmol/L — ABNORMAL LOW (ref 101–111)
GFR calc non Af Amer: >60 mL/min (ref >60)
Glucose, Bld: 110 mg/dL — ABNORMAL HIGH (ref 65–99)
POTASSIUM: 3.7 mmol/L (ref 3.5–5.1)
SODIUM: 134 mmol/L — AB (ref 135–145)
Total Bilirubin: 0.5 mg/dL (ref 0.3–1.2)
Total Protein: 5.7 g/dL — ABNORMAL LOW (ref 6.5–8.1)

## 2016-10-31 LAB — HIV ANTIBODY (ROUTINE TESTING W REFLEX): HIV Screen 4th Generation wRfx: NONREACTIVE

## 2016-10-31 NOTE — Progress Notes (Signed)
Subjective: No new complaints   Antibiotics:  Anti-infectives    Start     Dose/Rate Route Frequency Ordered Stop   10/29/16 0547  cefUROXime (ZINACEF) 1.5 g in dextrose 5 % 50 mL IVPB     1.5 g 100 mL/hr over 30 Minutes Intravenous 60 min pre-op 10/29/16 0547 10/29/16 0738      Medications: Scheduled Meds: . acetaminophen  1,000 mg Oral Q6H   Or  . acetaminophen (TYLENOL) oral liquid 160 mg/5 mL  1,000 mg Oral Q6H  . bisacodyl  10 mg Oral Daily  . enoxaparin (LOVENOX) injection  40 mg Subcutaneous Q24H  . fentaNYL   Intravenous Q4H  . losartan  100 mg Oral Daily   And  . hydrochlorothiazide  25 mg Oral Daily  . insulin aspart  0-24 Units Subcutaneous Q4H  . pantoprazole  40 mg Oral Daily  . senna-docusate  1 tablet Oral QHS   Continuous Infusions: . bupivacaine ON-Q pain pump    . dextrose 5 % and 0.45% NaCl 50 mL/hr at 10/30/16 1000   PRN Meds:.cyclobenzaprine, diphenhydrAMINE **OR** diphenhydrAMINE, levalbuterol, naloxone **AND** sodium chloride flush, ondansetron (ZOFRAN) IV, potassium chloride (KCL MULTIRUN) 30 mEq in 265 mL IVPB    Objective: Weight change:   Intake/Output Summary (Last 24 hours) at 10/31/16 1607 Last data filed at 10/31/16 1431  Gross per 24 hour  Intake              915 ml  Output             1880 ml  Net             -965 ml   Blood pressure 138/74, pulse 72, temperature 98.6 F (37 C), temperature source Oral, resp. rate 16, height  (1.803 m), weight 174 lb 6.1 oz (79.1 kg), SpO2 93 %. Temp:  [98.1 F (36.7 C)-99.9 F (37.7 C)] 98.6 F (37 C) (04/11 1155) Pulse Rate:  [65-99] 72 (04/11 1155) Resp:  [13-25] 16 (04/11 1233) BP: (116-148)/(64-74) 138/74 (04/11 1155) SpO2:  [92 %-100 %] 93 % (04/11 1233)  Physical Exam: General: Alert and awake, oriented x3, not in any acute distress. Central line in place HEENT: anicteric sclera, , EOMI CVS regular rate, normal r,  no murmur rubs or gallops Chest: fairly clear to  auscultation bilaterally, no wheezing Abdomen: soft nontender, nondistended, normal bowel sounds, Extremities: no  clubbing or edema noted bilaterally Skin: no rashes Neuro: nonfocal  CBC: (wbc3,Hgb:3,Hct:3,Plt:3,INR:3APTT:3)@   BMET  Recent Labs  10/30/16 0320 10/31/16 0524  NA 134* 134*  K 3.5 3.7  CL 99* 96*  CO2 26 29  GLUCOSE 127* 110*  BUN 8 8  CREATININE 0.71 0.88  CALCIUM 8.6* 8.8*     Liver Panel   Recent Labs  10/31/16 0524  PROT 5.7*  ALBUMIN 2.8*  AST 20  ALT 15*  ALKPHOS 67  BILITOT 0.5       Sedimentation Rate No results for input(s): ESRSEDRATE in the last 72 hours. C-Reactive Protein No results for input(s): CRP in the last 72 hours.  Micro Results: Recent Results (from the past 720 hour(s))  Surgical pcr screen     Status: None   Collection Time: 10/25/16  4:05 PM  Result Value Ref Range Status   MRSA, PCR NEGATIVE NEGATIVE Final   Staphylococcus aureus NEGATIVE NEGATIVE Final    Comment:        The Xpert SA Assay (FDA approved  for NASAL specimens in patients over 79 years of age), is one component of a comprehensive surveillance program.  Test performance has been validated by [facility] for patients greater than or equal to 21 year old. It is not intended to diagnose infection nor to guide or monitor treatment.   Aerobic/Anaerobic Culture (surgical/deep wound)     Status: None (Preliminary result)   Collection Time: 10/29/16  9:58 AM  Result Value Ref Range Status   Specimen Description TISSUE RIGHT LUNG  Final   Special Requests NONE  Final   Gram Stain   Final    RARE WBC PRESENT, PREDOMINANTLY PMN NO ORGANISMS SEEN    Culture   Final    RARE SERRATIA MARCESCENS CRITICAL RESULT CALLED TO, READ BACK BY AND VERIFIED WITH: DR VAN DAM 10/31/16 @ 1518 M VESTAL    Report Status PENDING  Incomplete  Acid Fast Smear (AFB)     Status: None   Collection Time: 10/29/16  9:58 AM  Result Value Ref Range Status    AFB Specimen Processing Concentration  Final   Acid Fast Smear Positive  Final    Comment: CRITICAL RESULT CALLED TO, READ BACK BY AND VERIFIED WITH: Rosette Reveal RN, AT [phone] 10/30/16 BY D. VANHOOK (NOTE) 1+, 4-36 acid-fast bacilli per 100 fields at 400X magnification, fluorescent smear REPORTED TO DORQUITA V. 10/30/16 @ 7:16AM SB FAX# [phone] Performed At: [facility] [address] [place], [place] [id number] Mila Homer MD WU:[id number]    Source (AFB) TISSUE  Final    Comment: RIGHT LUNG     Studies/Results: Dg Chest Port 1 View  Result Date: 10/30/2016 CLINICAL DATA:  Status post partial lobectomy on the right.  COPD EXAM: PORTABLE CHEST 1 VIEW COMPARISON:  Portable chest x-ray of October 29, 2016 FINDINGS: The small right apical pneumothorax has decreased in size and amounts to less than 5% of the lung volume. The 2 right-sided chest tubes are in stable position. The interstitial markings of the right lung are coarse especially in the upper lobe inter stable. The left lung is clear. There is no pleural effusion. The heart is normal in size. There is calcification in the wall of the aortic arch. The trachea is midline. The right internal jugular venous catheter tip projects over the midportion of the SVC. IMPRESSION: Less than 5% right apical pneumothorax. This has decreased in volume since the earlier study. The chest tubes are in stable position. Persistent mild interstitial prominence in the upper portion of the right lung following VATS. Thoracic aortic atherosclerosis. Electronically Signed   By: David  [place]iland M.D.   On: 10/30/2016 07:21      Assessment/Plan INTERVAL HISTORY: Serratia grew form nodule --of doubtful signficance   Active Problems:   S/P partial lobectomy of lung   Acid fast bacillus   Smoker   Chronic bronchitis (HCC)    [name] Ross is a 56 y.o. male with with upper lob nodule with AFB + smear  #1 AFB positive lung nodule: Obviously  the concer is for possible MTB  --hopefully we can get some answers with MTB, M avium, NTM PCR probe --if not I would recommend ultimately starting him on RIPE and pluggin him in with GHD until we can get data on cultures  Called lab to discuss.   LOS: 2 days   [name] Ross 10/31/2016, 4:07 PM

## 2016-10-31 NOTE — Discharge Summary (Signed)
Physician Discharge Summary  Patient ID: ARLAND USERY MRN: [id number] DOB/AGE: [birth year] 57 y.o.  Admit date: 10/29/2016 Discharge date: 11/02/2016  Admission Diagnoses:  Patient Active Problem List   Diagnosis Date Noted  . Smoker   . Chronic bronchitis (HCC)   . Nodule of right lung 10/25/2016  . COPD (chronic obstructive pulmonary disease) with emphysema (HCC) 10/25/2016   Discharge Diagnoses:   Patient Active Problem List   Diagnosis Date Noted  . Encounter for chest tube removal   . Pneumothorax   . Postop check   . Acid fast bacillus   . Smoker   . Chronic bronchitis (HCC)   . S/P partial lobectomy of lung 10/29/2016  . Nodule of right lung 10/25/2016  . COPD (chronic obstructive pulmonary disease) with emphysema (HCC) 10/25/2016   Discharged Condition: good  History of Present Illness:  [name] Ross is a 56 yo white male initially evaluated by Dr. Tyrone Sage in June 2017.  The patient had a CXR in March of that year after presenting to an Urgent care with complaints of cough. That showed a nodule in the right upper lobe.  The patient followed up with PCP in May of last year and repeat CXR showed persistent presence of the RUL.  CT scan was performed and confirmed the presence of the nodule.  The patient is a long term smoker of at least a pack per day over 30 years.  At initial evaluation it was felt these lesions were concerning for malignancy and PET CT scan was recommended.  This was done and showed the nodule to be of low uptake, however he was found to have an area in the distal segment of his sigmoid colon felt to be concerning. He was therefore referred to GI who performed colonoscopy which was okay.  Dr. Tyrone Sage recommended surgical resection, but the patient wished to follow up in a few months with a repeat scan.  He was again evaluated in August of last year and was agreeable to proceed with a navigational biopsy, which ultimately was negative. It was again  recommended the patient have surgical resection for definitive diagnosis.  He again refused and wished to have a repeat CT scan in a few months.  This was performed and the patient followed up with Dr. Tyrone Sage at which time the lesion had grown marginally in size.  Due to his smoking history it was still felt surgical resection would be the best treatment option for definitive diagnosis.  The patient was now agreeable to proceed with VATS procedure.  The risks and benefits were explained to the patient and he was agreeable to proceed.     Hospital Course:   [name] Ross presented to [facility] on 10/29/2016.  He was taken to the operating room and underwent Video Bronchoscopy and Right VATS with wedge resection RUL.  Frozen pathology obtained was felt to be infectious and no malignancy was present.  The patient tolerated the procedure without difficulty, was extubated and taken to the recovery room in stable condition.  POD #1 the patient's chest tube was free from air leak and transitioned to water seal.  Pathology came back positive for AFB.  Due to this the patient was placed on contact precautions and ID consult was obtained.  They evaluated the patient and requested send out of the patient's lung nodule for further testing for diagnosis.  The patients follow up CXR showed improvement of pneumothorax. His output remained minimal and no air leak was present.  Therefore, his anterior chest tube was removed. His remaining chest tube remained stable.  No air leak was present and his final chest tube was removed on POD # 3.  Again follow up CXR was free from pneumothorax.  ID workup was negative for MTB.  It was felt the patient likely has MT Avium.  This will not require treatment unless patient becomes symptomatic.  He is ambulating.  His pain is well controlled.  He is medically stable for discharge home today.   Consults: ID  Treatments: surgery:    Bronchoscopy, right video-assisted  thoracoscopy, mini thoracotomy, wedge resection of right upper lobe, and placement of On-Q.  Disposition: 01-Home or Self Care   Discharge Medications:   Allergies as of 11/02/2016      Reactions   Codeine Hives   Demerol [meperidine] Hives      Medication List    TAKE these medications   acetaminophen 500 MG tablet Commonly known as:  TYLENOL Take 1,000 mg by mouth every 6 (six) hours as needed.   azelastine 0.1 % nasal spray Commonly known as:  ASTELIN Place 2 sprays into both nostrils 2 (two) times daily.   cyclobenzaprine 5 MG tablet Commonly known as:  FLEXERIL Take 1 tablet (5 mg total) by mouth 3 (three) times daily as needed for muscle spasms.   GOODY HEADACHE PO Take 1 packet by mouth daily as needed (headache, pain).   losartan-hydrochlorothiazide 100-25 MG tablet Commonly known as:  HYZAAR Take 1 tablet by mouth daily.   omeprazole 20 MG tablet Commonly known as:  PRILOSEC OTC Take 20 mg by mouth daily.   Oxycodone HCl 10 MG Tabs Take 1 tablet (10 mg total) by mouth every 4 (four) hours as needed for severe pain.      Follow-up Information    Delight Ovens, MD Follow up.   Specialty:  Cardiothoracic Surgery Why:  please see discharge paperwork for appointments Please obtain a chest xray 1/2 hour prior to seeing Dr Tyrone Sage from [facility] imaging which is located in the same office complex Contact information: [address] [place] [address] [phone]           Signed: Lowella Dandy 11/02/2016, 10:07 AM

## 2016-10-31 NOTE — Progress Notes (Signed)
Anterior Chest tube discontinued per PA-C order.  Patient tolerated well.

## 2016-10-31 NOTE — Progress Notes (Addendum)
      [address]       [name] Ross [address]             [phone]      2 Days Post-Op Procedure(s) (LRB): VIDEO ASSISTED THORACOSCOPY (VATS)/Right upper lobe wedge RESECTION (Right) VIDEO BRONCHOSCOPY (N/A)   Subjective:  Complains of pain at chest tube sites.  Denies shortness of breath.  No BM, + flatus  Objective: Vital signs in last 24 hours: Temp:  [98.1 F (36.7 C)-99.9 F (37.7 C)] 98.1 F (36.7 C) (04/11 0759) Pulse Rate:  [65-99] 65 (04/11 0759) Cardiac Rhythm: Normal sinus rhythm (04/10 1945) Resp:  [13-25] 13 (04/11 0759) BP: (116-152)/(64-87) 116/64 (04/11 0759) SpO2:  [92 %-100 %] 92 % (04/11 0759) Arterial Line BP: (151-158)/(55-67) 158/55 (04/10 1000) Weight:  [174 lb 6.1 oz (79.1 kg)] 174 lb 6.1 oz (79.1 kg) (04/10 1143)  Intake/Output from previous day: 04/10 0701 - 04/11 0700 In: 1155 [P.O.:480; I.V.:675] Out: 1685 [Urine:1335; Chest Tube:350] Intake/Output this shift: Total I/O In: -  Out: 400 [Urine:400]  General appearance: alert, cooperative and no distress Heart: regular rate and rhythm Lungs: clear to auscultation bilaterally Abdomen: soft, non-tender; bowel sounds normal; no masses,  no organomegaly Extremities: extremities normal, atraumatic, no cyanosis or edema Wound: clean and dry  Lab Results:  Recent Labs  10/30/16 0320 10/31/16 0524  WBC 10.6* 10.3  HGB 10.7* 10.7*  HCT 32.6* 32.5*  PLT 286 322   BMET:  Recent Labs  10/30/16 0320 10/31/16 0524  NA 134* 134*  K 3.5 3.7  CL 99* 96*  CO2 26 29  GLUCOSE 127* 110*  BUN 8 8  CREATININE 0.71 0.88  CALCIUM 8.6* 8.8*    PT/INR: No results for input(s): LABPROT, INR in the last 72 hours. ABG    Component Value Date/Time   PHART 7.410 10/30/2016 0347   HCO3 27.9 10/30/2016 0347   ACIDBASEDEF 2.6 (H) 10/25/2016 1509   O2SAT 96.4 10/30/2016 0347   CBG (last 3)   Recent Labs  10/30/16 2352 10/31/16 0406 10/31/16 0758  GLUCAP 128* 125* 107*     Assessment/Plan: S/P Procedure(s) (LRB): VIDEO ASSISTED THORACOSCOPY (VATS)/Right upper lobe wedge RESECTION (Right) VIDEO BRONCHOSCOPY (N/A)  1. CV- NSR, BP controlled- on home regimen of HCTZ, Cozaar 2. Chest tube- no air leak present, output remains minimal will d/c Anterior chest tube today 3. Pulm- off oxygen, continue IS.Marland Kitchen CXR shows improvement of right apical pneumothorax 4. Renal- creatinine WNL, adequate oral intake.. Will place IV Fluids to KVO 5. Pain control- continue PCA, encouraged patient to utilize oral medications for additional relief 6. ID- + AFB pathology for lung nodule, ID is following, pathology to be sent out 7. Dispo- patient stable, d/c anterior chest tube today, decrease IV Fluids, appreciate ID recommendations   LOS: 2 days    BARRETT, ERIN 10/31/2016 I have seen and examined [name] Ross and agree with the above assessment  and plan.  Delight Ovens MD Beeper [phone] Office [phone] 10/31/2016 9:14 PM

## 2016-10-31 NOTE — Plan of Care (Signed)
Problem: Pain Managment: Goal: General experience of comfort will improve Outcome: Progressing Patient reporting that his pain has decreased today allowing him to raise his right arm above his head which he was previously unable to do.

## 2016-11-01 ENCOUNTER — Inpatient Hospital Stay (HOSPITAL_COMMUNITY): Payer: BLUE CROSS/BLUE SHIELD

## 2016-11-01 DIAGNOSIS — Z09 Encounter for follow-up examination after completed treatment for conditions other than malignant neoplasm: Secondary | ICD-10-CM

## 2016-11-01 DIAGNOSIS — J939 Pneumothorax, unspecified: Secondary | ICD-10-CM

## 2016-11-01 DIAGNOSIS — Z4682 Encounter for fitting and adjustment of non-vascular catheter: Secondary | ICD-10-CM

## 2016-11-01 LAB — HEPATITIS B SURFACE ANTIGEN: Hepatitis B Surface Ag: NEGATIVE

## 2016-11-01 LAB — GLUCOSE, CAPILLARY
Glucose-Capillary: 105 mg/dL — ABNORMAL HIGH (ref 65–99)
Glucose-Capillary: 107 mg/dL — ABNORMAL HIGH (ref 65–99)
Glucose-Capillary: 109 mg/dL — ABNORMAL HIGH (ref 65–99)
Glucose-Capillary: 114 mg/dL — ABNORMAL HIGH (ref 65–99)
Glucose-Capillary: 125 mg/dL — ABNORMAL HIGH (ref 65–99)
Glucose-Capillary: 99 mg/dL (ref 65–99)

## 2016-11-01 LAB — MTB NAA WITHOUT AFB CULTURE: M Tuberculosis, Naa: NEGATIVE

## 2016-11-01 LAB — HEPATITIS C ANTIBODY (REFLEX): HCV Ab: <0.1 s/co ratio (ref 0.0–0.9)

## 2016-11-01 LAB — HCV COMMENT:

## 2016-11-01 MED ORDER — OXYCODONE HCL 5 MG PO TABS
10.0000 mg | ORAL_TABLET | ORAL | Status: DC | PRN
  Administered 2016-11-01 – 2016-11-02 (×2): 10 mg via ORAL
  Filled 2016-11-01 (×2): qty 2

## 2016-11-01 MED ORDER — OXYCODONE HCL 5 MG PO TABS
5.0000 mg | ORAL_TABLET | ORAL | Status: DC | PRN
  Administered 2016-11-01: 5 mg via ORAL
  Filled 2016-11-01: qty 1

## 2016-11-01 NOTE — Progress Notes (Signed)
Subjective: No new complaints   Antibiotics:  Anti-infectives    Start     Dose/Rate Route Frequency Ordered Stop   10/29/16 0547  cefUROXime (ZINACEF) 1.5 g in dextrose 5 % 50 mL IVPB     1.5 g 100 mL/hr over 30 Minutes Intravenous 60 min pre-op 10/29/16 0547 10/29/16 0738      Medications: Scheduled Meds: . acetaminophen  1,000 mg Oral Q6H   Or  . acetaminophen (TYLENOL) oral liquid 160 mg/5 mL  1,000 mg Oral Q6H  . bisacodyl  10 mg Oral Daily  . enoxaparin (LOVENOX) injection  40 mg Subcutaneous Q24H  . losartan  100 mg Oral Daily   And  . hydrochlorothiazide  25 mg Oral Daily  . insulin aspart  0-24 Units Subcutaneous Q4H  . pantoprazole  40 mg Oral Daily  . senna-docusate  1 tablet Oral QHS   Continuous Infusions: . dextrose 5 % and 0.45% NaCl 50 mL/hr at 10/30/16 1000   PRN Meds:.cyclobenzaprine, levalbuterol, oxyCODONE, potassium chloride (KCL MULTIRUN) 30 mEq in 265 mL IVPB    Objective: Weight change:   Intake/Output Summary (Last 24 hours) at 11/01/16 1943 Last data filed at 11/01/16 1738  Gross per 24 hour  Intake             1435 ml  Output             1025 ml  Net              410 ml   Blood pressure (!) 153/91, pulse (!) 110, temperature 98.9 F (37.2 C), temperature source Oral, resp. rate 20, height  (1.803 m), weight 174 lb 6.1 oz (79.1 kg), SpO2 97 %. Temp:  [98.5 F (36.9 C)-99.4 F (37.4 C)] 98.9 F (37.2 C) (04/12 1736) Pulse Rate:  [81-119] 110 (04/12 1816) Resp:  [14-28] 20 (04/12 1816) BP: (133-157)/(76-91) 153/91 (04/12 1736) SpO2:  [90 %-97 %] 97 % (04/12 1816)  Physical Exam: General: Alert and awake, oriented x3, not in any acute distress. Central line in place HEENT: anicteric sclera, , EOMI CVS regular rate, normal r,  no murmur rubs or gallops Chest: fairly clear to auscultation bilaterally, no wheezing Abdomen: soft nontender, nondistended, normal bowel sounds, Extremities: no  clubbing or edema noted  bilaterally Skin: no rashes Neuro: nonfocal  CBC: (wbc3,Hgb:3,Hct:3,Plt:3,INR:3APTT:3)@   BMET  Recent Labs  10/30/16 0320 10/31/16 0524  NA 134* 134*  K 3.5 3.7  CL 99* 96*  CO2 26 29  GLUCOSE 127* 110*  BUN 8 8  CREATININE 0.71 0.88  CALCIUM 8.6* 8.8*     Liver Panel   Recent Labs  10/31/16 0524  PROT 5.7*  ALBUMIN 2.8*  AST 20  ALT 15*  ALKPHOS 67  BILITOT 0.5       Sedimentation Rate No results for input(s): ESRSEDRATE in the last 72 hours. C-Reactive Protein No results for input(s): CRP in the last 72 hours.  Micro Results: Recent Results (from the past 720 hour(s))  Surgical pcr screen     Status: None   Collection Time: 10/25/16  4:05 PM  Result Value Ref Range Status   MRSA, PCR NEGATIVE NEGATIVE Final   Staphylococcus aureus NEGATIVE NEGATIVE Final    Comment:        The Xpert SA Assay (FDA approved for NASAL specimens in patients over 36 years of age), is one component of a comprehensive surveillance program.  Test performance has been  validated by [facility] for patients greater than or equal to 72 year old. It is not intended to diagnose infection nor to guide or monitor treatment.   Aerobic/Anaerobic Culture (surgical/deep wound)     Status: None (Preliminary result)   Collection Time: 10/29/16  9:58 AM  Result Value Ref Range Status   Specimen Description TISSUE RIGHT LUNG  Final   Special Requests NONE  Final   Gram Stain   Final    RARE WBC PRESENT, PREDOMINANTLY PMN NO ORGANISMS SEEN    Culture   Final    RARE SERRATIA MARCESCENS CRITICAL RESULT CALLED TO, READ BACK BY AND VERIFIED WITH: DR VAN DAM 10/31/16 @ 1518 M VESTAL NO ANAEROBES ISOLATED; CULTURE IN PROGRESS FOR 5 DAYS    Report Status PENDING  Incomplete   Organism ID, Bacteria SERRATIA MARCESCENS  Final      Susceptibility   Serratia marcescens - MIC*    CEFAZOLIN >=64 RESISTANT Resistant     CEFEPIME <=1 SENSITIVE Sensitive     CEFTAZIDIME <=1  SENSITIVE Sensitive     CEFTRIAXONE <=1 SENSITIVE Sensitive     CIPROFLOXACIN <=0.25 SENSITIVE Sensitive     GENTAMICIN <=1 SENSITIVE Sensitive     TRIMETH/SULFA <=20 SENSITIVE Sensitive     * RARE SERRATIA MARCESCENS  Acid Fast Smear (AFB)     Status: None   Collection Time: 10/29/16  9:58 AM  Result Value Ref Range Status   AFB Specimen Processing Concentration  Final   Acid Fast Smear Positive  Final    Comment: CRITICAL RESULT CALLED TO, READ BACK BY AND VERIFIED WITH: Rosette Reveal RN, AT [phone] 10/30/16 BY D. VANHOOK (NOTE) 1+, 4-36 acid-fast bacilli per 100 fields at 400X magnification, fluorescent smear REPORTED TO DORQUITA V. 10/30/16 @ 7:16AM SB FAX# [phone] Performed At: [facility] [address] [place], [place] [id number] Mila Homer MD AO:[id number]    Source (AFB) TISSUE  Final    Comment: RIGHT LUNG     Studies/Results: Dg Chest Port 1 View  Result Date: 11/01/2016 CLINICAL DATA:  Status post removal of 1 right-sided chest tube placed for pneumothorax. EXAM: PORTABLE CHEST 1 VIEW COMPARISON:  Portable chest x-ray of October 30, 2016 FINDINGS: The right lateral chest tube has been removed. The remaining tube has its tip projecting over the posterior aspect of the right fifth rib. There has been reaccumulation of a small right apical pneumothorax. The interstitial markings within the right lung remain coarse. The left lung is well-expanded and clear. The heart and pulmonary vascularity are normal. The mediastinum is normal in width. The bony thorax exhibits no acute abnormality. IMPRESSION: Interval reaccumulation of an approximately 5% right apical pneumothorax since removal of the lateral right-sided chest tube. The remaining chest tube is in stable position. Electronically Signed   By: David  [place]aziland M.D.   On: 11/01/2016 07:30      Assessment/Plan INTERVAL HISTORY: Serratia grew form nodule --of doubtful signficance  MTB probe is negative for  TB   Active Problems:   S/P partial lobectomy of lung   Acid fast bacillus   Smoker   Chronic bronchitis (HCC)    KRISTINA BERTONE is a 56 y.o. 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 Not clear that he would require treatment for this given paucity of symptoms   DC airborne  After recovering from this PCP can determine down the road if he needs referral to ID for treatment of M avium.   I will sign off for now.    LOS: 3 days   Acey Lav 11/01/2016, 7:43 PM

## 2016-11-01 NOTE — Progress Notes (Addendum)
[address]       [place] [address]             [phone]      3 Days Post-Op Procedure(s) (LRB): VIDEO ASSISTED THORACOSCOPY (VATS)/Right upper lobe wedge RESECTION (Right) VIDEO BRONCHOSCOPY (N/A) Subjective: Feels pretty well, not SOB,  tm 99.4  Objective: Vital signs in last 24 hours: Temp:  [98.2 F (36.8 C)-99.4 F (37.4 C)] 99.1 F (37.3 C) (04/12 0350) Pulse Rate:  [65-99] 81 (04/12 0350) Cardiac Rhythm: Normal sinus rhythm (04/12 0731) Resp:  [14-21] 14 (04/12 0517) BP: (133-153)/(74-83) 153/83 (04/12 0350) SpO2:  [92 %-98 %] 92 % (04/12 0517)  Hemodynamic parameters for last 24 hours:    Intake/Output from previous day: 04/11 0701 - 04/12 0700 In: 1188.3 [P.O.:1080; I.V.:108.3] Out: 1475 [Urine:1375; Chest Tube:100] Intake/Output this shift: No intake/output data recorded.  General appearance: alert, cooperative and no distress Heart: regular rate and rhythm Lungs: clear to auscultation bilaterally Abdomen: benign Extremities: PAS in place Wound: incis healing well  Lab Results:  Recent Labs  10/30/16 0320 10/31/16 0524  WBC 10.6* 10.3  HGB 10.7* 10.7*  HCT 32.6* 32.5*  PLT 286 322   BMET:  Recent Labs  10/30/16 0320 10/31/16 0524  NA 134* 134*  K 3.5 3.7  CL 99* 96*  CO2 26 29  GLUCOSE 127* 110*  BUN 8 8  CREATININE 0.71 0.88  CALCIUM 8.6* 8.8*    PT/INR: No results for input(s): LABPROT, INR in the last 72 hours. ABG    Component Value Date/Time   PHART 7.410 10/30/2016 0347   HCO3 27.9 10/30/2016 0347   ACIDBASEDEF 2.6 (H) 10/25/2016 1509   O2SAT 96.4 10/30/2016 0347   CBG (last 3)   Recent Labs  10/31/16 2023 10/31/16 2321 11/01/16 0350  GLUCAP 110* 127* 114*    Meds Scheduled Meds: . acetaminophen  1,000 mg Oral Q6H   Or  . acetaminophen (TYLENOL) oral liquid 160 mg/5 mL  1,000 mg Oral Q6H  . bisacodyl  10 mg Oral Daily  . enoxaparin (LOVENOX) injection  40 mg Subcutaneous Q24H  .  fentaNYL   Intravenous Q4H  . losartan  100 mg Oral Daily   And  . hydrochlorothiazide  25 mg Oral Daily  . insulin aspart  0-24 Units Subcutaneous Q4H  . pantoprazole  40 mg Oral Daily  . senna-docusate  1 tablet Oral QHS   Continuous Infusions: . bupivacaine ON-Q pain pump    . dextrose 5 % and 0.45% NaCl 50 mL/hr at 10/30/16 1000   PRN Meds:.cyclobenzaprine, diphenhydrAMINE **OR** diphenhydrAMINE, levalbuterol, naloxone **AND** sodium chloride flush, ondansetron (ZOFRAN) IV, potassium chloride (KCL MULTIRUN) 30 mEq in 265 mL IVPB  Xrays Dg Chest Port 1 View  Result Date: 11/01/2016 CLINICAL DATA:  Status post removal of 1 right-sided chest tube placed for pneumothorax. EXAM: PORTABLE CHEST 1 VIEW COMPARISON:  Portable chest x-ray of October 30, 2016 FINDINGS: The right lateral chest tube has been removed. The remaining tube has its tip projecting over the posterior aspect of the right fifth rib. There has been reaccumulation of a small right apical pneumothorax. The interstitial markings within the right lung remain coarse. The left lung is well-expanded and clear. The heart and pulmonary vascularity are normal. The mediastinum is normal in width. The bony thorax exhibits no acute abnormality. IMPRESSION: Interval reaccumulation of an approximately 5% right apical pneumothorax since removal of the lateral right-sided chest tube. The remaining chest  tube is in stable position. Electronically Signed   By: David  [place] M.D.   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  Delight Ovens MD Beeper [phone] Office [phone] 11/01/2016 12:58 PM

## 2016-11-01 NOTE — Progress Notes (Signed)
Pts PCA discontinued at 1300 with 3ml of fentanyl remaining in syringe. Wittnessed by Joni Reining, Char[employer] fundraiser.

## 2016-11-01 NOTE — Discharge Instructions (Signed)
Video-Assisted Thoracic Surgery, Care After °This sheet gives you information about how to care for yourself after your procedure. Your doctor may also give you more specific instructions. If you have problems or questions, contact your doctor. °What can I expect after the procedure? °After the procedure, it is common to have: °· Some pain and soreness in your chest. °· Pain when you breathe in (inhale) and cough. °· Trouble pooping (constipation). °· Tiredness (fatigue). °· Trouble sleeping. ° °Follow these instructions at home: °Preventing lung infection ( °pneumonia) °· Take deep breaths or do breathing exercises as told by your doctor. °· Cough often. Coughing is important to clear thick spit (phlegm) and open your lungs. °· You can make coughing hurt less if you try supporting (splinting) your chest. Try one of these when you cough: °? Hold a pillow against your chest. °? Place both hands flat on top of your cut. °· Use an incentive spirometer as told by your doctor. This is a tool that measures how well you can fill your lungs with each breath. °· Do lung therapy (pulmonary rehabilitation) as told. °Medicines °· Take over-the-counter or prescription medicines only as told by your doctor. °· If you have pain, take pain-relieving medicine before your pain gets very bad. Doing this will help you breathe and cough more comfortably. °· If you were prescribed an antibiotic medicine, take it as told by your doctor. Do not stop taking the antibiotic even if you start to feel better. °Activity °· Ask your doctor what activities are safe for you. °· Avoid activities that use your chest muscles for 3-4 weeks or longer. °· Do not lift anything that is heavier than 10 lb (4.5 kg), or the limit that your doctor tells you, until he or she says that it is safe. °Cut ( °incision) care °· Follow instructions from your doctor about how to take care of your cut(s) from surgery. Make sure you: °? Wash your hands with soap and  water before you change your bandage (dressing). If you cannot use soap and water, use hand sanitizer. °? Change your bandage as told by your doctor. °? Leave stitches (sutures), skin glue, or skin tape (adhesive) strips in place. They may need to stay in place for 2 weeks or longer. If tape strips get loose and curl up, you may trim the loose edges. Do not remove tape strips completely unless your doctor says it is okay. °· Keep your bandage dry until it has been removed. °· Every day, check the area around your cut(s) for signs of infection. Check for: °? Redness, swelling, or pain. °? Fluid or blood. °? Warmth. °? Pus or a bad smell. °Bathing °· Do not take baths, swim, or use a hot tub until your doctor approves. You may take showers. °· After your bandage is removed, use soap and water to gently wash the your cut(s) from surgery. Do not use anything else to clean your cut(s) unless your doctor tells you to do this. °Driving °· Do not drive until your doctor approves. °· Do not drive or use heavy machinery while taking prescription pain medicine. °Eating and drinking °· Eat a healthy diet as told by your doctor. A healthy diet includes: °? Lots of fresh fruits and vegetables. °? Whole grains. °? Low-fat (lean) proteins. °· Limit foods that are high in fat and processed sugars. These include fried and sweet foods. °· Drink enough fluid to keep your pee (urine) clear or light yellow. °General instructions °·   To prevent or treat trouble pooping while you are taking prescription pain medicine, your doctor may recommend that you:  Take over-the-counter or prescription medicines.  Eat foods that have a lot of fiber. These include beans, fresh fruits and vegetables, and whole grains.  Do not use any products that contain nicotine or tobacco. These include cigarettes and e-cigarettes. If you need help quitting, ask your doctor.  Avoid being where people are smoking (avoid secondhand smoke).  Wear compression  stockings as told by your doctor. These stockings help you:  Not get blood clots in your legs.  Have less swelling in your legs.  If you have a chest tube, care for it as told by your doctor.  Do not travel by airplane during the 2 weeks after your chest tube is removed, or until your doctor says that this is safe.  Keep all follow-up visits as told by your doctor. This is important. Contact a doctor if:  You have redness, swelling, or pain around a cut from surgery.  You have fluid or blood coming from a cut from surgery.  Your cut(s) from surgery feel warm to the touch.  You have pus or a bad smell coming from a cut from surgery.  You have a fever or chills.  You feel sick to your stomach (nauseous).  You throw up (vomit).  You have pain that does not get better with medicine. Get help right away if:  You have chest pain.  Your heart is fluttering or beating fast.  You start to have a rash.  You have shortness of breath.  You have trouble breathing.  You are confused.  You have trouble talking or understanding.  You feel weak, light-headed, or dizzy.  You faint. Summary  To help prevent lung infection (pneumonia), take deep breaths or do breathing exercises as told by your doctor.  Cough often. This is important for clearing chest fluid (phlegm). You can make coughing hurt less if you hold a pillow to your chest or put your hands flat on the cut(s) when you cough (do splinting).  Do not drive until your doctor approves.  Every day, check your cut(s) for signs of infection. These signs can be redness, swelling, pain, fluid, blood, warmth, pus, or a bad smell.  Eat a healthy diet. This includes lots of fresh fruits and vegetables, whole grains, and low-fat (lean) proteins. This information is not intended to replace advice given to you by your health care provider. Make sure you discuss any questions you have with your health care provider. Document  Released: 11/03/2012 Document Revised: 06/18/2016 Document Reviewed: 06/18/2016 Elsevier Interactive Patient Education  2017 [employer] Thoracotomy, Care After This sheet gives you information about how to care for yourself after your procedure. Your health care provider may also give you more specific instructions. If you have problems or questions, contact your health care provider. What can I expect after the procedure? After your procedure, it is common to have:  Pain and swelling around the incision area.  Pain when you breathe in (inhale).  Constipation.  Fatigue.  Loss of appetite.  Trouble sleeping.  Mood swings and depression. Follow these instructions at home: Preventing pneumonia   Take deep breaths or do breathing exercises as instructed by your health care provider.  Cough frequently. Coughing may cause discomfort, but it is important to clear mucus (phlegm) and expand your lungs. If coughing hurts, hold a pillow against your chest or place both hands flat on top  of the incision (splinting) when you cough. This may help relieve discomfort.  Continue to use an incentive spirometer as directed. This is a tool that measures how well you fill your lungs with each breath.  Participate in pulmonary rehabilitation as directed. This is a program that combines education, exercise, and support from a team of specialists. The goal is to help you heal and return to normal activities as soon as possible. Medicines   Take over-the-counter or prescription medicines only as told by your health care provider.  If you have pain, take pain-relieving medicine before your pain becomes severe. This is important because if your pain is under control, you will be able to breathe and cough more comfortably.  If you were prescribed an antibiotic medicine, take it as told by your health care provider. Do not stop taking the antibiotic even if you start to feel better. Activity   Ask your  health care provider what activities are safe for you.  Do not travel by airplane for 2 weeks after your chest tube is removed, or until your health care provider says that this is safe.  Do not lift anything that is heavier than 10 lb (4.5 kg), or the limit that your health care provider tells you, until he or she says that it is safe.  Do not drive until your health care provider approves.  Do not drive or use heavy machinery while taking prescription pain medicine. Incision care   Follow instructions from your health care provider about how to take care of your incision. Make sure you:  Wash your hands with soap and water before you change your bandage (dressing). If soap and water are not available, use hand sanitizer.  Change your dressing as told by your health care provider.  Leave stitches (sutures), skin glue, or adhesive strips in place. These skin closures may need to stay in place for 2 weeks or longer. If adhesive strip edges start to loosen and curl up, you may trim the loose edges. Do not remove adhesive strips completely unless your health care provider tells you to do that.  Keep your dressing dry.  Check your incision area every day for signs of infection. Check for:  More redness, swelling, or pain.  More fluid or blood.  Warmth.  Pus or a bad smell. Bathing   Do not take baths, swim, or use a hot tub until your health care provider approves. You may take showers.  After your dressing has been removed, use soap and water to gently wash your incision area. Do not use anything else to clean your incision unless your health care provider tells you to do that. Eating and drinking   Eat a healthy diet as instructed by your health care provider. A healthy diet includes plenty of fresh fruits and vegetables, whole grains, and low-fat (lean) proteins.  Drink enough fluid to keep your urine clear or pale yellow. General instructions   To prevent or treat  constipation while you are taking prescription pain medicine, your health care provider may recommend that you:  Take over-the-counter or prescription medicines.  Eat foods that are high in fiber, such as fresh fruits and vegetables, whole grains, and beans.  Limit foods that are high in fat and processed sugars, such as fried and sweet foods.  Do not use any products that contain nicotine or tobacco, such as cigarettes and e-cigarettes. If you need help quitting, ask your health care provider.  Avoid secondhand smoke.  Wear compression stockings as told by your health care provider. These stockings help to prevent blood clots and reduce swelling in your legs.  If you have a chest tube, care for it as instructed.  Keep all follow-up visits as told by your health care provider. This is important. Contact a health care provider if:  You have more redness, swelling, or pain around your incision.  You have more fluid or blood coming from your incision.  Your incision feels warm to the touch.  You have pus or a bad smell coming from your incision.  You have a fever or chills.  Your heartbeat seems irregular.  You have nausea or vomiting.  You have muscle aches.  You are constipated. This may mean that you have:  Fewer bowel movements in a week than normal.  Difficulty having a bowel movement.  Stools that are dry, hard, or larger than normal. Get help right away if:  You develop a rash.  You feel light-headed or feel like you are going to faint.  You have shortness of breath or trouble breathing.  You are confused.  You have trouble speaking.  You have vision problems.  You are not able to move.  You have numbness in your face, arms, or legs.  You lose consciousness.  You have a sudden, severe headache.  You feel weak.  You have chest pain.  You have pain that:  Is severe.  Gets worse, even with medicine. Summary  To prevent pneumonia, take deep  breaths, do breathing exercises, and cough frequently, as instructed by your health care provider.  Do not drive until your health care provider approves. Do not travel by airplane for 2 weeks after your chest tube is removed, or until your health care provider approves.  Check your incision area every day for signs of infection.  Eat a healthy diet that includes plenty of fresh fruits and vegetables, whole grains, and low-fat (lean) proteins. This information is not intended to replace advice given to you by your health care provider. Make sure you discuss any questions you have with your health care provider. Document Released: 12/22/2010 Document Revised: 04/02/2016 Document Reviewed: 04/02/2016 Elsevier Interactive Patient Education  2017 [employer].

## 2016-11-01 NOTE — Plan of Care (Signed)
Problem: Education: Goal: Knowledge of [facility] Education information/materials will improve Outcome: Progressing Pts VSS, A&Ox4, uing urinal to void and with good PO intake. Pts CVAD and right chest tube removed with no complications. Pt c/o pain at site and receiving PO oxycodone for relief. Fentanyl PCA d/c'd today. Airborne precautions discontinued per ID and no RIPE therapy initiated. Pts blood sugars WNL and no SSi given. Family at bedside, plan to d/c tomorrow. Will continue to monitor.

## 2016-11-01 NOTE — Plan of Care (Signed)
Problem: Physical Regulation: Goal: Ability to maintain clinical measurements within normal limits will improve Outcome: Progressing Patient VS remain stable and WNL post op

## 2016-11-02 ENCOUNTER — Inpatient Hospital Stay (HOSPITAL_COMMUNITY): Payer: BLUE CROSS/BLUE SHIELD

## 2016-11-02 LAB — GLUCOSE, CAPILLARY
Glucose-Capillary: 122 mg/dL — ABNORMAL HIGH (ref 65–99)
Glucose-Capillary: 151 mg/dL — ABNORMAL HIGH (ref 65–99)

## 2016-11-02 MED ORDER — CYCLOBENZAPRINE HCL 5 MG PO TABS: 5.0000 mg | ORAL_TABLET | Freq: Three times a day (TID) | ORAL | 0 refills | Status: DC | PRN

## 2016-11-02 MED ORDER — OXYCODONE HCL 10 MG PO TABS: 10.0000 mg | ORAL_TABLET | ORAL | 0 refills | Status: DC | PRN

## 2016-11-02 NOTE — Progress Notes (Signed)
      [address]       [place] [address]             [phone]       4 Days Post-Op Procedure(s) (LRB): VIDEO ASSISTED THORACOSCOPY (VATS)/Right upper lobe wedge RESECTION (Right) VIDEO BRONCHOSCOPY (N/A)   Subjective:  No complaints.  Wants to go home.  Objective: Vital signs in last 24 hours: Temp:  [97.6 F (36.4 C)-98.9 F (37.2 C)] 97.6 F (36.4 C) (04/13 0949) Pulse Rate:  [81-119] 108 (04/13 0949) Cardiac Rhythm: Normal sinus rhythm (04/13 0717) Resp:  [16-28] 19 (04/13 0949) BP: (112-153)/(67-91) 135/81 (04/13 0949) SpO2:  [90 %-97 %] 96 % (04/13 0949)  Intake/Output from previous day: 04/12 0701 - 04/13 0700 In: 856.7 [I.V.:856.7] Out: 975 [Urine:975] Intake/Output this shift: Total I/O In: 240 [P.O.:240] Out: -   General appearance: alert, cooperative and no distress Heart: regular rate and rhythm Lungs: clear to auscultation bilaterally Abdomen: soft, non-tender; bowel sounds normal; no masses,  no organomegaly Extremities: extremities normal, atraumatic, no cyanosis or edema Wound: clean and dry  Lab Results:  Recent Labs  10/31/16 0524  WBC 10.3  HGB 10.7*  HCT 32.5*  PLT 322   BMET:  Recent Labs  10/31/16 0524  NA 134*  K 3.7  CL 96*  CO2 29  GLUCOSE 110*  BUN 8  CREATININE 0.88  CALCIUM 8.8*    PT/INR: No results for input(s): LABPROT, INR in the last 72 hours. ABG    Component Value Date/Time   PHART 7.410 10/30/2016 0347   HCO3 27.9 10/30/2016 0347   ACIDBASEDEF 2.6 (H) 10/25/2016 1509   O2SAT 96.4 10/30/2016 0347   CBG (last 3)   Recent Labs  11/01/16 2338 11/02/16 0348 11/02/16 0946  GLUCAP 99 122* 151*    Assessment/Plan: S/P Procedure(s) (LRB): VIDEO ASSISTED THORACOSCOPY (VATS)/Right upper lobe wedge RESECTION (Right) VIDEO BRONCHOSCOPY (N/A)  1. Chest tube- removed yesterday, CXR is free from pneumothorax 2. CV- remains hemodynamically stable 3. ID- + AFB,  MTB probe negative,  likely felt to be M. Avium and being asymptomatic no treatment is indicated at this time 4. Dispo- patient stable, continues to be asymptomatic, will d/c home today   LOS: 4 days    Raford Pitcher, Denny Peon 11/02/2016

## 2016-11-02 NOTE — Progress Notes (Signed)
Discharge Note:  Patient alert and oriented X 4 and in no distress.  Patient and his wife given discharge instructions regarding signs and symptoms to report, wound care, medications, diet, activity, and upcoming appointments.  They both verbalized understanding of all instructions.  Telemetry and peripheral IV discontinued.  Patient confirmed that he had all of his personal belongings. He was transported out via wheelchair by hospital volunteer.

## 2016-11-02 NOTE — Plan of Care (Signed)
Problem: Pain Managment: Goal: General experience of comfort will improve Outcome: Progressing Pt reports improved pain control on his current regimen following removal of chest tubes.  He still reports significant pain but indicates that it is better controlled.

## 2016-11-02 NOTE — Care Management Note (Signed)
Case Management Note  Patient Details  Name: CARLES FLOREA MRN: [id number] Date of Birth: Sep 29, 1960  Subjective/Objective:   s /p VATS lung resection today, conts on fentanyl pca, chest tubes to suction, iv abx.   4/13 Letha Cape RN, BSN - patient is for dc today, no needs.                             Action/Plan:   Expected Discharge Date:  11/02/16               Expected Discharge Plan:  Home/Self Care  In-House Referral:     Discharge planning Services  CM Consult  Post Acute Care Choice:    Choice offered to:     DME Arranged:    DME Agency:     HH Arranged:    HH Agency:     Status of Service:  Completed, signed off  If discussed at [employer] of Stay Meetings, dates discussed:    Additional Comments:  Leone Haven, RN 11/02/2016, 2:00 PM

## 2016-11-03 LAB — AEROBIC/ANAEROBIC CULTURE W GRAM STAIN (SURGICAL/DEEP WOUND)

## 2016-11-08 ENCOUNTER — Other Ambulatory Visit: Payer: Self-pay | Admitting: *Deleted

## 2016-11-08 ENCOUNTER — Encounter (INDEPENDENT_AMBULATORY_CARE_PROVIDER_SITE_OTHER): Payer: Self-pay

## 2016-11-08 DIAGNOSIS — G8918 Other acute postprocedural pain: Secondary | ICD-10-CM

## 2016-11-08 DIAGNOSIS — Z902 Acquired absence of lung [part of]: Secondary | ICD-10-CM

## 2016-11-08 DIAGNOSIS — Z4802 Encounter for removal of sutures: Secondary | ICD-10-CM

## 2016-11-08 MED ORDER — OXYCODONE HCL 5 MG PO TABS
5.0000 mg | ORAL_TABLET | ORAL | 0 refills | Status: DC | PRN
Start: 2016-11-08 — End: 2016-11-26

## 2016-11-09 DIAGNOSIS — J984 Other disorders of lung: Secondary | ICD-10-CM | POA: Diagnosis not present

## 2016-11-09 DIAGNOSIS — I10 Essential (primary) hypertension: Secondary | ICD-10-CM | POA: Diagnosis not present

## 2016-11-09 DIAGNOSIS — E876 Hypokalemia: Secondary | ICD-10-CM | POA: Diagnosis not present

## 2016-11-09 DIAGNOSIS — Z6821 Body mass index (BMI) 21.0-21.9, adult: Secondary | ICD-10-CM | POA: Diagnosis not present

## 2016-11-14 ENCOUNTER — Telehealth: Payer: Self-pay

## 2016-11-14 DIAGNOSIS — G8918 Other acute postprocedural pain: Secondary | ICD-10-CM

## 2016-11-14 MED ORDER — TRAMADOL HCL 50 MG PO TABS: 50.0000 mg | ORAL_TABLET | Freq: Four times a day (QID) | ORAL | 0 refills | Status: DC | PRN

## 2016-11-14 NOTE — Telephone Encounter (Signed)
Patient called C/O oxycodone making him nauseous. Would like to try something different. Will try Tramadol 50 mg. Every 8 hours. Ready at front desk for pickup

## 2016-11-21 ENCOUNTER — Other Ambulatory Visit: Payer: Self-pay | Admitting: Cardiothoracic Surgery

## 2016-11-21 DIAGNOSIS — J939 Pneumothorax, unspecified: Secondary | ICD-10-CM

## 2016-11-22 ENCOUNTER — Ambulatory Visit: Payer: BLUE CROSS/BLUE SHIELD | Admitting: Cardiothoracic Surgery

## 2016-11-26 ENCOUNTER — Ambulatory Visit (INDEPENDENT_AMBULATORY_CARE_PROVIDER_SITE_OTHER): Payer: Self-pay | Admitting: Physician Assistant

## 2016-11-26 ENCOUNTER — Ambulatory Visit
Admission: RE | Admit: 2016-11-26 | Discharge: 2016-11-26 | Disposition: A | Discharge status: Home / Self Care | Payer: BLUE CROSS/BLUE SHIELD | Source: Ambulatory Visit | Attending: Cardiothoracic Surgery | Admitting: Cardiothoracic Surgery

## 2016-11-26 VITALS — BP 159/104 | HR 96 | Resp 20 | Ht 71.0 in | Wt 156.0 lb

## 2016-11-26 DIAGNOSIS — J939 Pneumothorax, unspecified: Secondary | ICD-10-CM | POA: Diagnosis not present

## 2016-11-26 DIAGNOSIS — Z09 Encounter for follow-up examination after completed treatment for conditions other than malignant neoplasm: Secondary | ICD-10-CM

## 2016-11-26 NOTE — Patient Instructions (Addendum)
You may continue to gradually increase your physical activity as tolerated.  Refrain from any heavy lifting or strenuous use of your arms and shoulders until at least 4 weeks from the time of your surgery, and avoid activities that cause increased pain in your chest on the side of your surgical incision.  Otherwise, you may continue to increase activities without any particular limitations.  Increase the intensity and duration of physical activity gradually.

## 2016-11-26 NOTE — Progress Notes (Addendum)
[address]       [place] [address]             [phone]       CARDIAC SURGERY POSTOPERATIVE VISIT  Patient Name: [name] Ross MRN: [id number] DOB: 10/29/1960  Subjective: [name] Ross is a 56 y.o. male here for routine follow up s/p bronchoscopy, right VATS, right mini thoracotomy wedge of RUL on 10/29/2016 by Dr. Tyrone Sage. Pathology showed inflammation and necrosis and not malignancy. On culutre, he was found to have Serratia Marcescens. AFB smear was positive (MTB probe negative). Dr. Daiva Eves thought he likely had M avium, but because he was clinically asymptomatic, he did not require treatment while in the hospital. Dr. Clinton Gallant recommendation was for patient to see his PCP and if necessary, him as an outpatient. Patient's only complaint is some soreness on right side of ribs below incision. He denies fever or cough.   Past Medical History:  Diagnosis Date  . Back pain   . Chronic bronchitis (HCC)   . COPD (chronic obstructive pulmonary disease) (HCC)   . GERD (gastroesophageal reflux disease)   . Multiple lung nodules   . Neuromuscular disorder (HCC)    leg- pain causing a reduced stamina for walking , due to leg pain.   . Tobacco abuse    Prior to Admission medications   Medication Sig Start Date End Date Taking? Authorizing Provider  acetaminophen (TYLENOL) 500 MG tablet Take 1,000 mg by mouth every 6 (six) hours as needed.    [provider]  Aspirin-Acetaminophen-Caffeine (GOODY HEADACHE PO) Take 1 packet by mouth daily as needed (headache, pain).    [provider]  azelastine (ASTELIN) 0.1 % nasal spray Place 2 sprays into both nostrils 2 (two) times daily. 09/28/16   [provider]  cyclobenzaprine (FLEXERIL) 5 MG tablet Take 1 tablet (5 mg total) by mouth 3 (three) times daily as needed for muscle spasms. 11/02/16   Barrett, Erin R, PA-C  losartan-hydrochlorothiazide (HYZAAR) 100-25 MG tablet Take 1 tablet by  mouth daily. 08/07/16   [provider]  omeprazole (PRILOSEC OTC) 20 MG tablet Take 20 mg by mouth daily.    [provider]  traMADol (ULTRAM) 50 MG tablet Take 1 tablet (50 mg total) by mouth every 6 (six) hours as needed for severe pain. 11/14/16   Delight Ovens, MD    Physical Exam:  Vitals:   11/26/16 1454 11/26/16 1459  BP: (!) 174/110 (!) 159/104  Pulse: 96 96  Resp: 20    CARDIOVASCULAR: Regular rate and rhythm. RESPIRATORY: Respiratory effort is normal. Lungs clear to auscultation. WOUNDS: Clean and dry. No signs of infection.  Imaging Studies: CLINICAL DATA:  History of RIGHT VATS with pneumothorax after surgery. No chest complaints.  EXAM: CHEST  2 VIEW  COMPARISON:  11/02/2016.  FINDINGS: Normal cardiomediastinal silhouette. RIGHT upper lobe volume loss with scarring, and surgical clips, appearing fairly similar to priors. There is no pneumothorax.  IMPRESSION: No active cardiopulmonary disease. Improved appearance from priors. Resolved pneumothorax.   Electronically Signed   By: Elsie Stain M.D.   On: 11/26/2016 14:50  Impression/Plan: Overall, [name] Ross continues to recover from right lung surgery. His blood pressure was very elevated today. Repeat blood pressure was 159/104. Patient states he has seen Dr. Duanne Guess and he is no longer taking Losartan/HCTZ. He is unsure of the new blood pressure medication he is taking. He states he has a follow  up to see him in June. I instructed him to call the office asap and make an appointment or at least get recommendations for his hypertension. He has stopped taking pain medication awhile ago. He is already driving. He wishes to return to work. 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 I told him he may return to work on Monday 12/03/2016 with lifting restrictions (no more than 10 pounds) until 12/10/2016. He has a follow up appointment to see Dr. Luciana Axeomer on 12/04/2016. I  left a voice mail instructing him of this. Also, I left a voice mail that he is to follow up with Dr. Tyrone SageGerhardt in 3 months with a chest x ray. Office will call patient in am with appointment date and time.    Doree Fudgeonielle Zimmerman, PA-C 11/26/2016 3:18 PM

## 2016-12-04 ENCOUNTER — Ambulatory Visit: Payer: BLUE CROSS/BLUE SHIELD | Admitting: Internal Medicine

## 2016-12-28 LAB — AFB ORGANISM ID BY DNA PROBE
M avium complex: NEGATIVE
M gordonae: NEGATIVE
M kansasii: NEGATIVE
M tuberculosis complex: NEGATIVE

## 2016-12-28 LAB — ACID FAST CULTURE WITH REFLEXED SENSITIVITIES (MYCOBACTERIA): Acid Fast Culture: POSITIVE — AB

## 2017-01-04 ENCOUNTER — Other Ambulatory Visit: Payer: Self-pay | Admitting: Family Medicine

## 2017-01-04 DIAGNOSIS — F17290 Nicotine dependence, other tobacco product, uncomplicated: Secondary | ICD-10-CM | POA: Diagnosis not present

## 2017-01-04 DIAGNOSIS — I10 Essential (primary) hypertension: Secondary | ICD-10-CM | POA: Diagnosis not present

## 2017-01-04 DIAGNOSIS — M79604 Pain in right leg: Secondary | ICD-10-CM

## 2017-01-04 DIAGNOSIS — F17200 Nicotine dependence, unspecified, uncomplicated: Secondary | ICD-10-CM

## 2017-01-04 DIAGNOSIS — I739 Peripheral vascular disease, unspecified: Secondary | ICD-10-CM | POA: Diagnosis not present

## 2017-01-04 DIAGNOSIS — M79605 Pain in left leg: Secondary | ICD-10-CM

## 2017-01-04 DIAGNOSIS — Z6820 Body mass index (BMI) 20.0-20.9, adult: Secondary | ICD-10-CM | POA: Diagnosis not present

## 2017-01-07 ENCOUNTER — Ambulatory Visit
Admission: RE | Admit: 2017-01-07 | Discharge: 2017-01-07 | Disposition: A | Discharge status: Home / Self Care | Payer: BLUE CROSS/BLUE SHIELD | Source: Ambulatory Visit | Attending: Family Medicine | Admitting: Family Medicine

## 2017-01-07 DIAGNOSIS — I739 Peripheral vascular disease, unspecified: Secondary | ICD-10-CM

## 2017-01-07 DIAGNOSIS — I7389 Other specified peripheral vascular diseases: Secondary | ICD-10-CM | POA: Diagnosis not present

## 2017-01-07 DIAGNOSIS — F17200 Nicotine dependence, unspecified, uncomplicated: Secondary | ICD-10-CM

## 2017-01-07 DIAGNOSIS — M79604 Pain in right leg: Secondary | ICD-10-CM

## 2017-01-07 DIAGNOSIS — M79605 Pain in left leg: Secondary | ICD-10-CM

## 2017-01-10 ENCOUNTER — Other Ambulatory Visit: Payer: Self-pay

## 2017-01-10 DIAGNOSIS — R6889 Other general symptoms and signs: Secondary | ICD-10-CM

## 2017-01-10 DIAGNOSIS — I739 Peripheral vascular disease, unspecified: Secondary | ICD-10-CM

## 2017-01-15 ENCOUNTER — Encounter (HOSPITAL_COMMUNITY): Payer: BLUE CROSS/BLUE SHIELD

## 2017-01-15 ENCOUNTER — Encounter: Payer: BLUE CROSS/BLUE SHIELD | Admitting: Vascular Surgery

## 2017-01-30 ENCOUNTER — Encounter: Payer: Self-pay | Admitting: Surgery

## 2017-02-04 DIAGNOSIS — Z6820 Body mass index (BMI) 20.0-20.9, adult: Secondary | ICD-10-CM | POA: Diagnosis not present

## 2017-02-04 DIAGNOSIS — M545 Low back pain: Secondary | ICD-10-CM | POA: Diagnosis not present

## 2017-02-04 DIAGNOSIS — I10 Essential (primary) hypertension: Secondary | ICD-10-CM | POA: Diagnosis not present

## 2017-02-04 DIAGNOSIS — M169 Osteoarthritis of hip, unspecified: Secondary | ICD-10-CM | POA: Diagnosis not present

## 2017-02-12 ENCOUNTER — Ambulatory Visit (HOSPITAL_COMMUNITY)
Admission: RE | Admit: 2017-02-12 | Discharge: 2017-02-12 | Disposition: A | Discharge status: Home / Self Care | Payer: BLUE CROSS/BLUE SHIELD | Source: Ambulatory Visit | Attending: Surgery | Admitting: Surgery

## 2017-02-12 ENCOUNTER — Ambulatory Visit (INDEPENDENT_AMBULATORY_CARE_PROVIDER_SITE_OTHER)
Admission: RE | Admit: 2017-02-12 | Discharge: 2017-02-12 | Disposition: A | Discharge status: Home / Self Care | Payer: BLUE CROSS/BLUE SHIELD | Source: Ambulatory Visit | Attending: Vascular Surgery | Admitting: Vascular Surgery

## 2017-02-12 DIAGNOSIS — I771 Stricture of artery: Secondary | ICD-10-CM | POA: Diagnosis not present

## 2017-02-12 DIAGNOSIS — R6889 Other general symptoms and signs: Secondary | ICD-10-CM | POA: Diagnosis not present

## 2017-02-12 DIAGNOSIS — I743 Embolism and thrombosis of arteries of the lower extremities: Secondary | ICD-10-CM | POA: Insufficient documentation

## 2017-02-12 DIAGNOSIS — I739 Peripheral vascular disease, unspecified: Secondary | ICD-10-CM

## 2017-02-12 DIAGNOSIS — M545 Low back pain: Secondary | ICD-10-CM | POA: Diagnosis not present

## 2017-02-12 DIAGNOSIS — M5431 Sciatica, right side: Secondary | ICD-10-CM | POA: Diagnosis not present

## 2017-02-13 ENCOUNTER — Ambulatory Visit (INDEPENDENT_AMBULATORY_CARE_PROVIDER_SITE_OTHER): Payer: BLUE CROSS/BLUE SHIELD | Admitting: Surgery

## 2017-02-13 ENCOUNTER — Encounter: Payer: Self-pay | Admitting: Surgery

## 2017-02-13 VITALS — BP 150/92 | HR 95 | Temp 98.2°F | Resp 20 | Ht 71.0 in | Wt 153.8 lb

## 2017-02-13 DIAGNOSIS — I70213 Atherosclerosis of native arteries of extremities with intermittent claudication, bilateral legs: Secondary | ICD-10-CM

## 2017-02-13 NOTE — Progress Notes (Signed)
[facility]  Patient name: [name] BrakemanRoger D Beigel MRN: [id number] DOB: 11/23/1960 Sex: male   REQUESTING PROVIDER:    Dr. Duanne Guessewey   REASON FOR CONSULT:    Claudication  HISTORY OF PRESENT ILLNESS:   [name] BrakemanRoger D Samudio is a 56 y.o. male, who is Referred today for evaluation of bilateral claudication.  The patient states that he has been having trouble for approximately 8 months.  He can walk approximately 100 feet.  At that time he starts getting pain in either his hip or his calf.  His right leg bothers him more than the left.  Stopping and resting causes his symptoms to go away.  He denies any nonhealing wounds.  He denies rest pain.  The patient is a current smoker.  He is undergone wedge resection in the recent past for lung nodule which turned out to be benign.  He does suffer from COPD.  PAST MEDICAL HISTORY    Past Medical History:  Diagnosis Date  . Back pain   . Chronic bronchitis (HCC)   . COPD (chronic obstructive pulmonary disease) (HCC)   . GERD (gastroesophageal reflux disease)   . Multiple lung nodules   . Neuromuscular disorder (HCC)    leg- pain causing a reduced stamina for walking , due to leg pain.   . Tobacco abuse      FAMILY HISTORY   Mother died in a car accident, questionable heart disease.  SOCIAL HISTORY:   Social History   Social History  . Marital status: Single    Spouse name: N/A  . Number of children: N/A  . Years of education: N/A   Occupational History  . Not on file.   Social History Main Topics  . Smoking status: Current Every Day Smoker    Packs/day: 0.50    Years: 30.00  . Smokeless tobacco: Never Used  . Alcohol use Yes     Comment: daily- beer, 3-4 /day   . Drug use: No  . Sexual activity: Not on file   Other Topics Concern  . Not on file   Social History Narrative  . No narrative on file    ALLERGIES:    Allergies  Allergen Reactions  . Codeine  Hives  . Demerol [Meperidine] Hives    CURRENT MEDICATIONS:    Current Outpatient Prescriptions  Medication Sig Dispense Refill  . acetaminophen (TYLENOL) 500 MG tablet Take 1,000 mg by mouth every 6 (six) hours as needed.    . Aspirin-Acetaminophen-Caffeine (GOODY HEADACHE PO) Take 1 packet by mouth daily as needed (headache, pain).    Marland Kitchen. azelastine (ASTELIN) 0.1 % nasal spray Place 2 sprays into both nostrils 2 (two) times daily.  11  . cyclobenzaprine (FLEXERIL) 5 MG tablet Take 1 tablet (5 mg total) by mouth 3 (three) times daily as needed for muscle spasms. 10 tablet 0  . omeprazole (PRILOSEC OTC) 20 MG tablet Take 20 mg by mouth daily.    . traMADol (ULTRAM) 50 MG tablet Take 1 tablet (50 mg total) by mouth every 6 (six) hours as needed for severe pain. 40 tablet 0   No current facility-administered medications for this visit.     REVIEW OF SYSTEMS:   [X]  denotes positive finding, [ ]  denotes negative finding Cardiac  Comments:  Chest pain or chest pressure:    Shortness of breath upon exertion:    Short of breath when lying flat:    Irregular heart rhythm:  Vascular    Pain in calf, thigh, or hip brought on by ambulation: x   Pain in feet at night that wakes you up from your sleep:     Blood clot in your veins:    Leg swelling:         Pulmonary    Oxygen at home:    Productive cough:     Wheezing:         Neurologic    Sudden weakness in arms or legs:  x   Sudden numbness in arms or legs:     Sudden onset of difficulty speaking or slurred speech:    Temporary loss of vision in one eye:     Problems with dizziness:         Gastrointestinal    Blood in stool:      Vomited blood:         Genitourinary    Burning when urinating:     Blood in urine:        Psychiatric    Major depression:         Hematologic    Bleeding problems:    Problems with blood clotting too easily:        Skin    Rashes or ulcers:        Constitutional    Fever or  chills:     PHYSICAL EXAM:   Vitals:   02/13/17 0921 02/13/17 0923  BP: (!) 148/95 (!) 150/92  Pulse: 95   Resp: 20   Temp: 98.2 F (36.8 C)   TempSrc: Oral   SpO2: 94%   Weight: 153 lb 12.8 oz (69.8 kg)   Height: 5\' 11"  (1.803 m)     GENERAL: The patient is a well-nourished male, in no acute distress. The vital signs are documented above. CARDIAC: There is a regular rate and rhythm.  VASCULAR: No carotid bruits.  Pedal pulses are not palpable.  He has palpable femoral pulses, the left is stronger than the right PULMONARY: Nonlabored respirations ABDOMEN: Soft and non-tender.  No pulsatile mass  MUSCULOSKELETAL: There are no major deformities or cyanosis. NEUROLOGIC: No focal weakness or paresthesias are detected. SKIN: There are no ulcers or rashes noted. PSYCHIATRIC: The patient has a normal affect.  STUDIES:   Outside ABIs are 0.70 on the right and 0.79 on the left.  I have ordered and reviewed his duplex study from today.  The left superficial femoral artery is occluded.  There is a 50-70% stenosis within the mid right superficial femoral artery.  There is a significant stenosis within the right distal common and proximal external iliac artery.  ASSESSMENT and PLAN   Peripheral vascular disease with claudication: I discussed with the patient that first I would like to optimize medical management prior to any intervention.  I discussed the importance of smoking cessation.  We also detailed an exercise program.  I am also going to start him on cilostazol.  The patient will come back in 3 months.  At that time we will reassess his clinical status.  If he has not had any improvement, we would proceed with angiography  Given the patient's vascular disease, he would benefit from a statin.  I do not have a copy of his cholesterol profile so I will defer this to Dr. Annye Englishewey   Wells Brabham, MD [facility] Tel [phone] Pager [phone]

## 2017-02-14 MED ORDER — CILOSTAZOL 100 MG PO TABS: 100.0000 mg | ORAL_TABLET | Freq: Two times a day (BID) | ORAL | 11 refills | Status: DC

## 2017-02-14 NOTE — Addendum Note (Signed)
Addended by: Nada LibmanBRABHAM, VANCE W on: 02/14/2017 04:21 PM   Modules accepted: Orders

## 2017-02-27 ENCOUNTER — Other Ambulatory Visit: Payer: Self-pay | Admitting: Cardiothoracic Surgery

## 2017-02-27 DIAGNOSIS — Z902 Acquired absence of lung [part of]: Secondary | ICD-10-CM

## 2017-02-28 ENCOUNTER — Encounter: Payer: Self-pay | Admitting: Cardiothoracic Surgery

## 2017-02-28 ENCOUNTER — Ambulatory Visit (INDEPENDENT_AMBULATORY_CARE_PROVIDER_SITE_OTHER): Payer: BLUE CROSS/BLUE SHIELD | Admitting: Cardiothoracic Surgery

## 2017-02-28 ENCOUNTER — Ambulatory Visit
Admission: RE | Admit: 2017-02-28 | Discharge: 2017-02-28 | Disposition: A | Discharge status: Home / Self Care | Payer: BLUE CROSS/BLUE SHIELD | Source: Ambulatory Visit | Attending: Cardiothoracic Surgery | Admitting: Cardiothoracic Surgery

## 2017-02-28 VITALS — BP 137/82 | HR 100 | Resp 20 | Ht 71.0 in | Wt 171.0 lb

## 2017-02-28 DIAGNOSIS — Z09 Encounter for follow-up examination after completed treatment for conditions other than malignant neoplasm: Secondary | ICD-10-CM

## 2017-02-28 DIAGNOSIS — Z902 Acquired absence of lung [part of]: Secondary | ICD-10-CM

## 2017-02-28 DIAGNOSIS — R918 Other nonspecific abnormal finding of lung field: Secondary | ICD-10-CM | POA: Diagnosis not present

## 2017-02-28 NOTE — Progress Notes (Signed)
[address]       [place] [id number]             [phone]      [name] Ross D [facility] Medical Record #[id number] Date of Birth: 03/11/1961  Referring: Lewis Moccasinewey, Elizabeth R, MD Primary Care: Lewis Moccasinewey, Elizabeth R, MD  Chief Complaint:   POST OP FOLLOW UP 10/29/2016   OPERATIVE REPORT PREOPERATIVE DIAGNOSIS:  Suspicious right upper lobe lung lesion. POSTOPERATIVE DIAGNOSIS:  Suspicious right upper lobe lung lesion. Inflammatory nodule by frozen section. PROCEDURE PERFORMED:  Bronchoscopy, right video-assisted thoracoscopy, mini thoracotomy, wedge resection of right upper lobe, and placement of On-Q. Diagnosis Lung, wedge biopsy/resection, Right upper lobe - INFLAMED AND NECROTIC LUNG PARENCHYMA, 2.6 CM. - THERE IS NO EVIDENCE OF MALIGNANCY - SEE COMMENT. Microscopic Comment Histologic evaluation reveals a relatively well circumscribed nodule that consists of central necrosis with associated peripheral fibrosis and inflammation. No malignant features are identified. AFB and GMS stains are negative. A PAS stain shows non specific change. However, the histologic features may be seen in a infectious process. Please also see the patient's concurrent microbiology specimens. (JBK:gt, 10/30/16) Pecola LeisureJOSHUA KISH MD  History of Present Illness:     here for routine follow up s/p bronchoscopy, right VATS, right mini thoracotomy wedge of RUL on 10/29/2016 . Pathology showed inflammation and necrosis and not malignancy. On culutre, he was found to have Serratia Marcescens. AFB smear was positive (MTB probe negative). Dr. Daiva EvesVan Dam thought he likely had M avium, but because he was clinically asymptomatic, he did not require treatment .  Patient continues to do well unfortunately several weeks after discharge from the hospital he returned to smoking, he continues to make an effort to stop. He denies any fever chills or productive cough he denies hemoptysis.     Past  Medical History:  Diagnosis Date  . Back pain   . Chronic bronchitis (HCC)   . COPD (chronic obstructive pulmonary disease) (HCC)   . GERD (gastroesophageal reflux disease)   . Multiple lung nodules   . Neuromuscular disorder (HCC)    leg- pain causing a reduced stamina for walking , due to leg pain.   . Tobacco abuse      History  Smoking Status  . Current Every Day Smoker  . Packs/day: 0.50  . Years: 30.00  Smokeless Tobacco  . Never Used    History  Alcohol Use  . Yes    Comment: daily- beer, 3-4 /day      Allergies  Allergen Reactions  . Codeine Hives  . Demerol [Meperidine] Hives    Current Outpatient Prescriptions  Medication Sig Dispense Refill  . acetaminophen (TYLENOL) 500 MG tablet Take 1,000 mg by mouth every 6 (six) hours as needed.    . Aspirin-Acetaminophen-Caffeine (GOODY HEADACHE PO) Take 1 packet by mouth daily as needed (headache, pain).    Marland Kitchen. azelastine (ASTELIN) 0.1 % nasal spray Place 2 sprays into both nostrils 2 (two) times daily.  11  . cilostazol (PLETAL) 100 MG tablet Take 1 tablet (100 mg total) by mouth 2 (two) times daily before a meal. 60 tablet 11  . cyclobenzaprine (FLEXERIL) 5 MG tablet Take 1 tablet (5 mg total) by mouth 3 (three) times daily as needed for muscle spasms. (Patient not taking: Reported on 02/28/2017) 10 tablet 0  . losartan (COZAAR) 100 MG tablet Take 100 mg by mouth daily.  3  . omeprazole (PRILOSEC OTC) 20 MG tablet Take 20  mg by mouth daily.    . traMADol (ULTRAM) 50 MG tablet Take 1 tablet (50 mg total) by mouth every 6 (six) hours as needed for severe pain. (Patient not taking: Reported on 02/28/2017) 40 tablet 0   No current facility-administered medications for this visit.        Physical Exam: BP 137/82   Pulse 100   Resp 20   Ht 5\' 11"  (1.803 m)   Wt 171 lb (77.6 kg)   SpO2 95%   BMI 23.85 kg/m   General appearance: alert and cooperative Neurologic: intact Heart: regular rate and rhythm, S1, S2 normal,  no murmur, click, rub or gallop Lungs: clear to auscultation bilaterally Abdomen: soft, non-tender; bowel sounds normal; no masses,  no organomegaly Extremities: Homans sign is negative, no sign of DVT Wound: Right chest incision and chest tube sites are well-healed   Diagnostic Studies & Laboratory data:     Recent Radiology Findings:   Dg Chest 2 View  Result Date: 02/28/2017 CLINICAL DATA:  Status post VATS procedure with partial lobectomy right lung. EXAM: CHEST  2 VIEW COMPARISON:  Nov 26, 2016 FINDINGS: There is stable postoperative change on the right. There is no edema or consolidation. No parenchymal nodular opacity evident. Heart size and pulmonary vascularity are normal. No adenopathy. No evident bone lesions. IMPRESSION: Postoperative change on the right. No edema or consolidation. No mass or adenopathy evident. Electronically Signed   By: Bretta Bang III M.D.   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 Today's chest x-ray is clear, only with postop changes. Plan to see the patient back in 3 months and obtain a follow-up CT scan of the chest which will be approximate 6 months postop. If that remains clear we'll refer the patient to the lung cancer screening program as he continues to be at high risk for development of lung cancer. He was encouraged to continue his efforts to stop smoking techniques were discussed.  Delight Ovens  MD      [address] [place] [place] [address] Office [phone]   Beeper [phone]  02/28/2017 10:39 AM

## 2017-03-05 DIAGNOSIS — K219 Gastro-esophageal reflux disease without esophagitis: Secondary | ICD-10-CM | POA: Diagnosis not present

## 2017-03-05 DIAGNOSIS — F17290 Nicotine dependence, other tobacco product, uncomplicated: Secondary | ICD-10-CM | POA: Diagnosis not present

## 2017-03-05 DIAGNOSIS — I739 Peripheral vascular disease, unspecified: Secondary | ICD-10-CM | POA: Diagnosis not present

## 2017-03-05 DIAGNOSIS — M545 Low back pain: Secondary | ICD-10-CM | POA: Diagnosis not present

## 2017-03-14 NOTE — Addendum Note (Signed)
Addendum  created 03/14/17 1251 by Delanna Blacketer, MD   Sign clinical note    

## 2017-04-22 DIAGNOSIS — I739 Peripheral vascular disease, unspecified: Secondary | ICD-10-CM | POA: Diagnosis not present

## 2017-04-22 DIAGNOSIS — K219 Gastro-esophageal reflux disease without esophagitis: Secondary | ICD-10-CM | POA: Diagnosis not present

## 2017-04-22 DIAGNOSIS — I10 Essential (primary) hypertension: Secondary | ICD-10-CM | POA: Diagnosis not present

## 2017-04-25 DIAGNOSIS — I739 Peripheral vascular disease, unspecified: Secondary | ICD-10-CM | POA: Diagnosis not present

## 2017-04-25 DIAGNOSIS — M25519 Pain in unspecified shoulder: Secondary | ICD-10-CM | POA: Diagnosis not present

## 2017-04-25 DIAGNOSIS — K219 Gastro-esophageal reflux disease without esophagitis: Secondary | ICD-10-CM | POA: Diagnosis not present

## 2017-04-25 DIAGNOSIS — Z23 Encounter for immunization: Secondary | ICD-10-CM | POA: Diagnosis not present

## 2017-04-25 DIAGNOSIS — I10 Essential (primary) hypertension: Secondary | ICD-10-CM | POA: Diagnosis not present

## 2017-05-06 IMAGING — CT CT CHEST SUPER D W/O CM
3 of 4 series · 17 of 30 positions shown, 19 images · non-contrast
Comparison: Chest CT 12/16/2015 and PET-CT 01/10/2016

CLINICAL DATA: Preop for right lung biopsy.

EXAM:
CT CHEST WITHOUT CONTRAST
TECHNIQUE: Multidetector CT imaging of the chest was performed using thin slice
collimation for electromagnetic bronchoscopy planning purposes,
without intravenous contrast.

[Series 3: chest w/o · axial · non-contrast · 0.70mm/px · z∈[-279,-24]mm · 7 of 136 slices shown, 9 images]
[im 17/136  mediastinal]
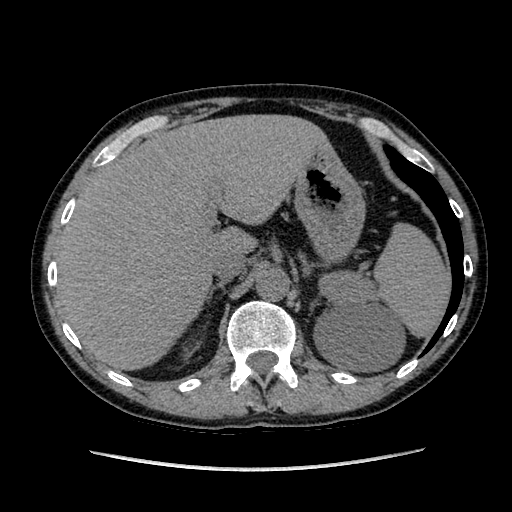
[im 17/136  lung]
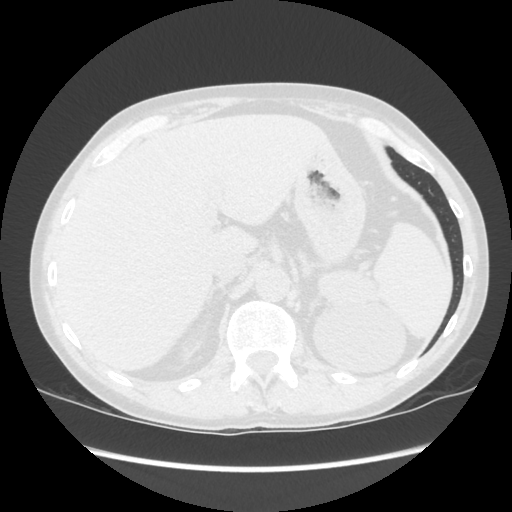
[im 34/136  lung]
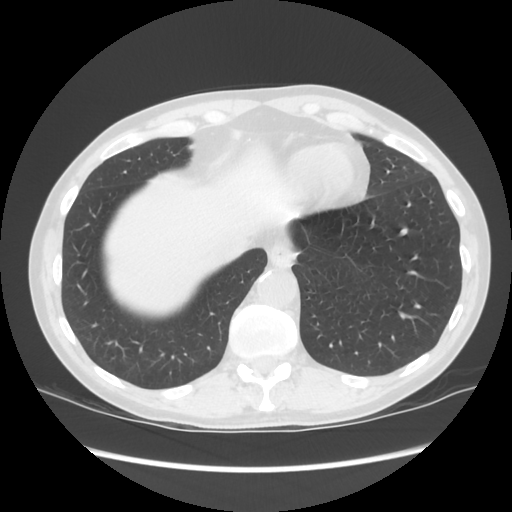
[im 51/136  lung]
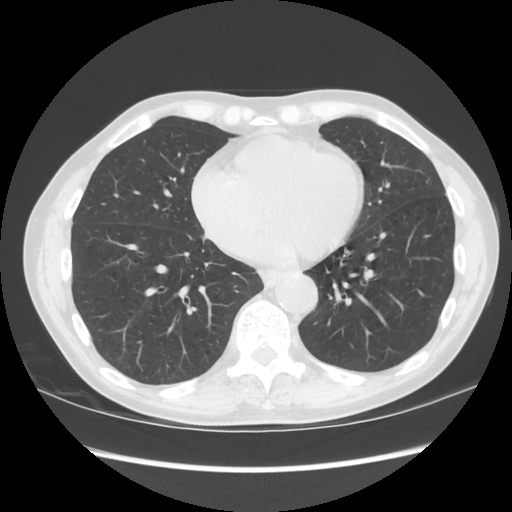
[im 68/136  lung]
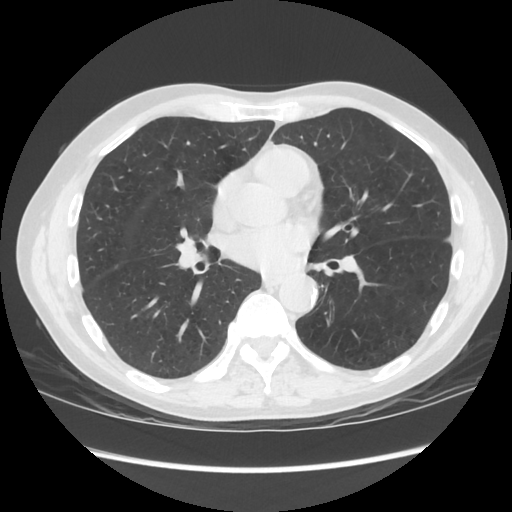
[im 85/136  mediastinal]
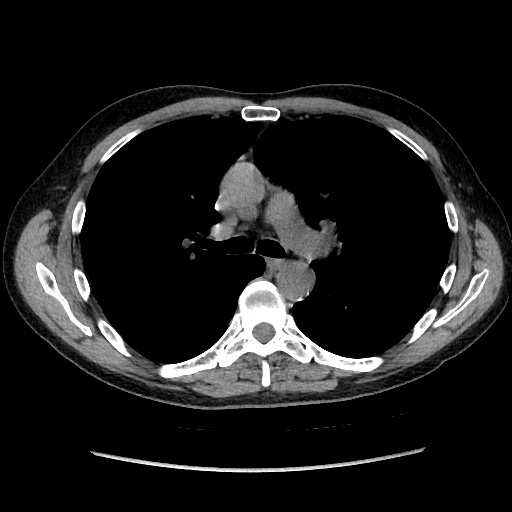
[im 85/136  lung]
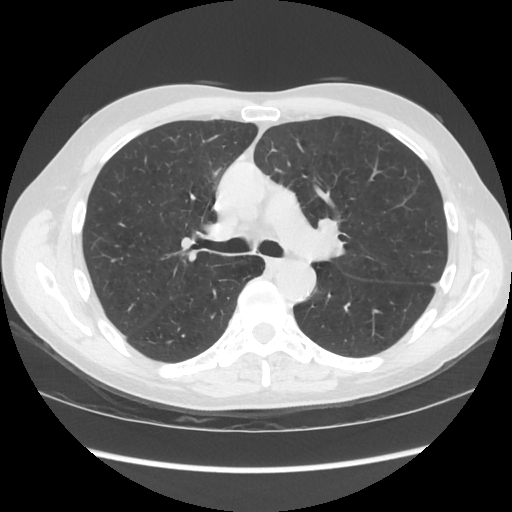
[im 102/136  lung]
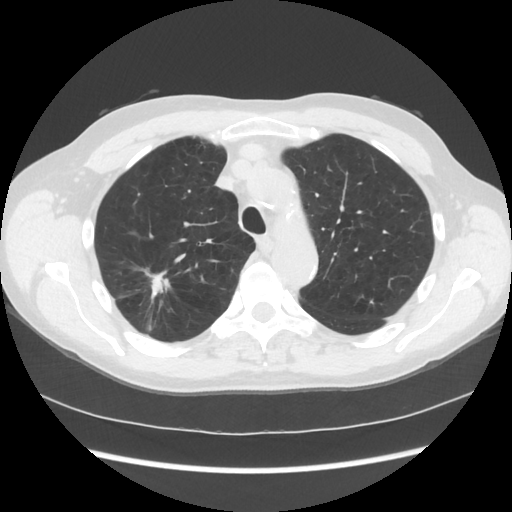
[im 119/136  lung]
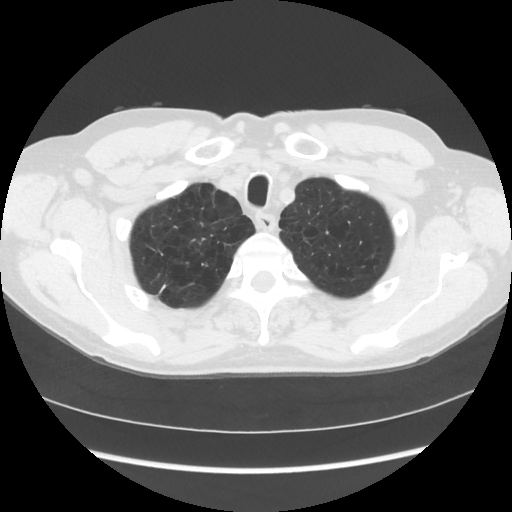

[Series 4: lung windows · axial · 0.70mm/px · z∈[-279,-24]mm · 7 of 136 slices shown]
[im 17/136  lung]
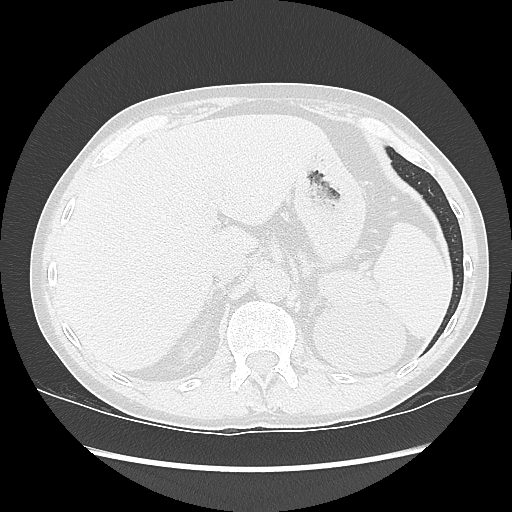
[im 34/136  lung]
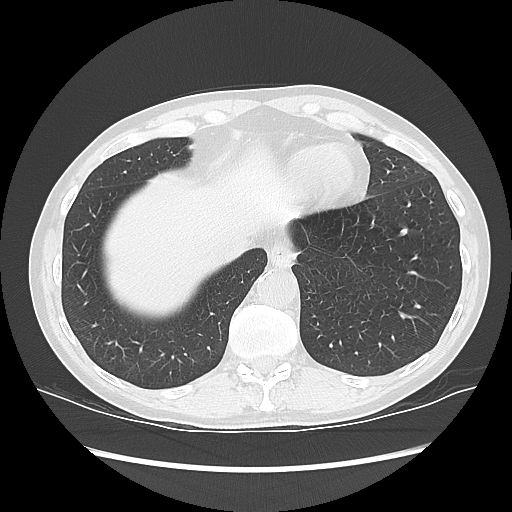
[im 51/136  lung]
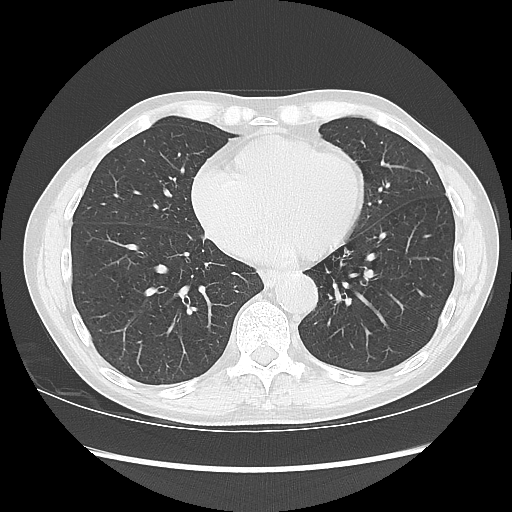
[im 68/136  lung]
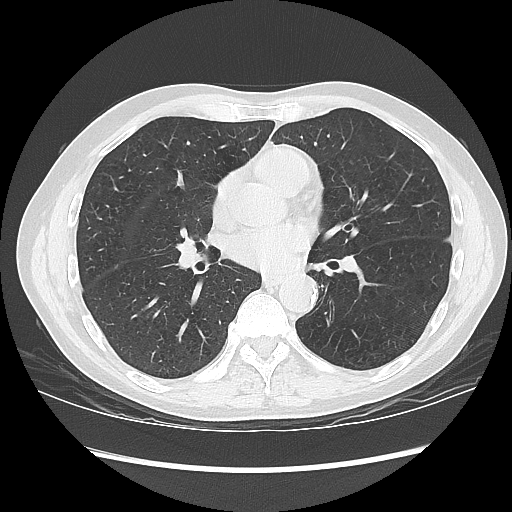
[im 85/136  lung]
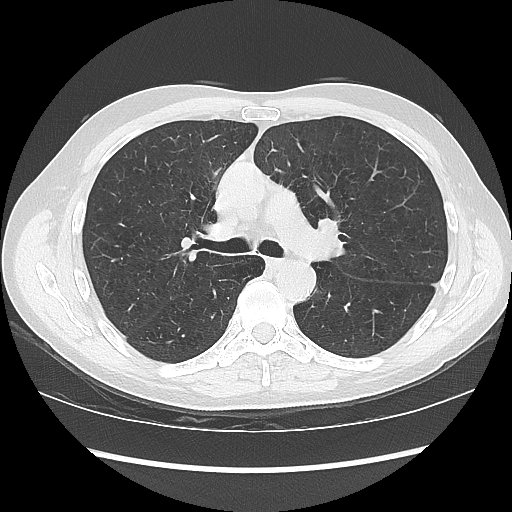
[im 102/136  lung]
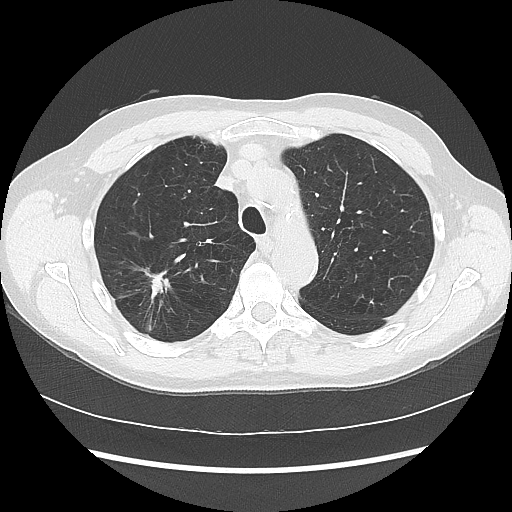
[im 119/136  lung]
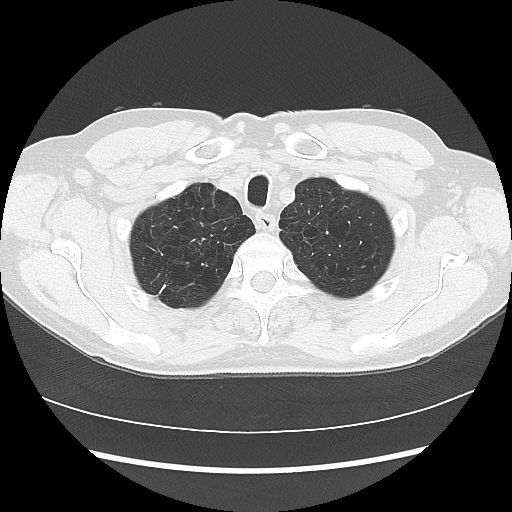

[Series 602: sagittal body · sagittal · 0.70mm/px · 3 of 145 slices shown]
[im 17/145  mediastinal]
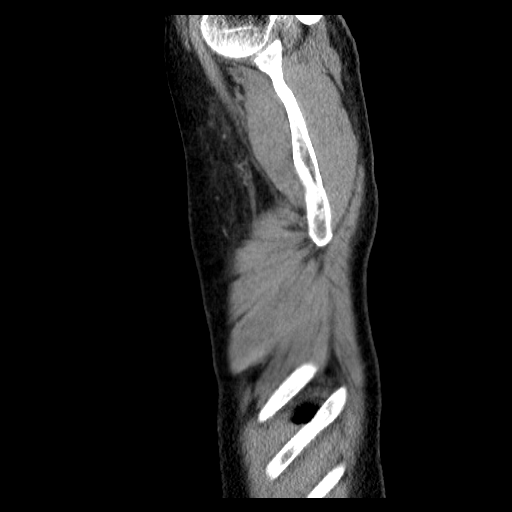
[im 33/145  mediastinal]
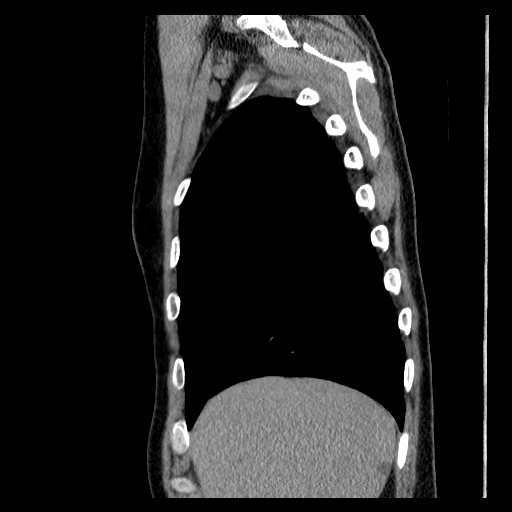
[im 49/145  mediastinal]
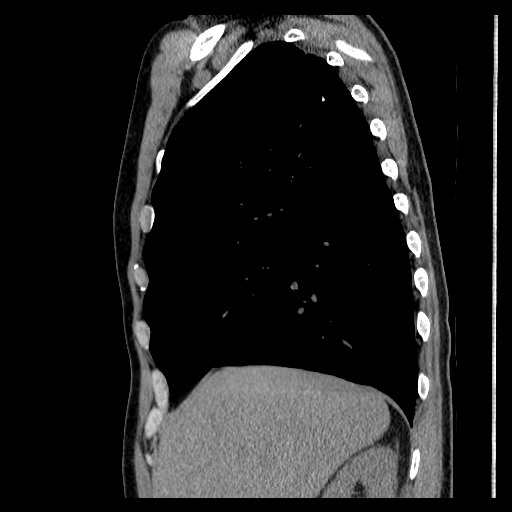

[17 of 30 positions shown; findings below may reference images not displayed]

FINDINGS: Chest wall: No chest wall mass, supraclavicular or axillary
lymphadenopathy. The thyroid gland is normal.

Cardiovascular: The heart is normal in size. No pericardial
effusion. Stable tortuosity and calcification of the thoracic aorta.
Stable coronary artery calcifications.

Mediastinum/Nodes: No new mediastinal or hilar mass or adenopathy.
Small scattered lymph nodes are stable. These were not metabolically
active on the PET-CT. The esophagus is grossly normal.

Lungs/Pleura: Stable advanced emphysematous changes and areas of
pulmonary scarring. The irregular right upper lobe density is
stable. It measures a maximum of 31.5 x 24 mm on the sagittal
sequence.

6.5 mm right upper lobe nodule on image number 54 is also stable. No
new pulmonary lesions. No acute overlying pulmonary findings. No
pleural effusion.

Upper Abdomen: Stable large left renal cyst. Stable hepatic cysts.
No adrenal gland lesions. Stable aortic calcifications.

Musculoskeletal: No significant bony findings.
IMPRESSION: 1. Stable irregular right upper lobe lesion and smaller right upper
lobe pulmonary nodule.
2. No mediastinal or hilar mass or adenopathy.
3. Stable underline emphysematous changes and areas of pulmonary
scarring.

## 2017-05-13 DIAGNOSIS — F17290 Nicotine dependence, other tobacco product, uncomplicated: Secondary | ICD-10-CM | POA: Diagnosis not present

## 2017-05-13 DIAGNOSIS — I10 Essential (primary) hypertension: Secondary | ICD-10-CM | POA: Diagnosis not present

## 2017-05-13 DIAGNOSIS — I739 Peripheral vascular disease, unspecified: Secondary | ICD-10-CM | POA: Diagnosis not present

## 2017-05-13 DIAGNOSIS — E782 Mixed hyperlipidemia: Secondary | ICD-10-CM | POA: Diagnosis not present

## 2017-05-16 ENCOUNTER — Other Ambulatory Visit: Payer: Self-pay | Admitting: Cardiothoracic Surgery

## 2017-05-16 DIAGNOSIS — R918 Other nonspecific abnormal finding of lung field: Secondary | ICD-10-CM

## 2017-05-20 ENCOUNTER — Encounter: Payer: Self-pay | Admitting: *Deleted

## 2017-05-20 ENCOUNTER — Ambulatory Visit (INDEPENDENT_AMBULATORY_CARE_PROVIDER_SITE_OTHER): Payer: BLUE CROSS/BLUE SHIELD | Admitting: Surgery

## 2017-05-20 ENCOUNTER — Other Ambulatory Visit: Payer: Self-pay | Admitting: *Deleted

## 2017-05-20 VITALS — BP 157/90 | HR 108 | Temp 98.2°F | Resp 16 | Ht 71.0 in | Wt 148.0 lb

## 2017-05-20 DIAGNOSIS — I70213 Atherosclerosis of native arteries of extremities with intermittent claudication, bilateral legs: Secondary | ICD-10-CM | POA: Diagnosis not present

## 2017-05-20 DIAGNOSIS — Z48812 Encounter for surgical aftercare following surgery on the circulatory system: Secondary | ICD-10-CM

## 2017-05-20 NOTE — Progress Notes (Addendum)
[facility] and [facility]ist of [facility]  Patient name: [name] Ross MRN: [id number] DOB: 05/13/1961 Sex: male   REASON FOR VISIT:    Follow up  HISOTRY OF PRESENT ILLNESS:    [name] Ross is a 57 y.o. male, who is Referred today for evaluation of bilateral claudication.  The patient states that he has been having trouble for approximately 8 months.  He can walk approximately 100 feet.  At that time he starts getting pain in either his hip or his calf.  His right leg bothers him more than the left.  Stopping and resting causes his symptoms to go away.  He denies any nonhealing wounds.  He denies rest pain.  The patient is a current smoker.  He is undergone wedge resection in the recent past for lung nodule which turned out to be benign.  He does suffer from COPD.  We started a medical treatment program consisting of exercise and smoking cessation as well as cilostazol.  He is back today for follow-up.  He states that his calf pain has essentially gone away with the addition of cilostazol.  He has had to miss work because of pain in his right hip.  PAST MEDICAL HISTORY:   Past Medical History:  Diagnosis Date  . Back pain   . Chronic bronchitis (HCC)   . COPD (chronic obstructive pulmonary disease) (HCC)   . GERD (gastroesophageal reflux disease)   . Multiple lung nodules   . Neuromuscular disorder (HCC)    leg- pain causing a reduced stamina for walking , due to leg pain.   . Tobacco abuse      FAMILY HISTORY:   No family history on file.  SOCIAL HISTORY:   Social History  Substance Use Topics  . Smoking status: Current Every Day Smoker    Packs/day: 0.50    Years: 30.00  . Smokeless tobacco: Never Used  . Alcohol use Yes     Comment: daily- beer, 3-4 /day      ALLERGIES:   Allergies  Allergen Reactions  . Codeine Hives  . Demerol [Meperidine] Hives     CURRENT MEDICATIONS:   Current Outpatient Prescriptions   Medication Sig Dispense Refill  . acetaminophen (TYLENOL) 500 MG tablet Take 1,000 mg by mouth every 6 (six) hours as needed.    . Aspirin-Acetaminophen-Caffeine (GOODY HEADACHE PO) Take 1 packet by mouth daily as needed (headache, pain).    Marland Kitchen azelastine (ASTELIN) 0.1 % nasal spray Place 2 sprays into both nostrils 2 (two) times daily.  11  . cilostazol (PLETAL) 100 MG tablet Take 1 tablet (100 mg total) by mouth 2 (two) times daily before a meal. 60 tablet 11  . cyclobenzaprine (FLEXERIL) 5 MG tablet Take 1 tablet (5 mg total) by mouth 3 (three) times daily as needed for muscle spasms. (Patient not taking: Reported on 02/28/2017) 10 tablet 0  . losartan (COZAAR) 100 MG tablet Take 100 mg by mouth daily.  3  . omeprazole (PRILOSEC OTC) 20 MG tablet Take 20 mg by mouth daily.    . traMADol (ULTRAM) 50 MG tablet Take 1 tablet (50 mg total) by mouth every 6 (six) hours as needed for severe pain. (Patient not taking: Reported on 02/28/2017) 40 tablet 0   No current facility-administered medications for this visit.     REVIEW OF SYSTEMS:   [X]  denotes positive finding, [ ]  denotes negative finding Cardiac  Comments:  Chest pain or chest pressure:    Shortness of  breath upon exertion:    Short of breath when lying flat:    Irregular heart rhythm:        Vascular    Pain in calf, thigh, or hip brought on by ambulation: x   Pain in feet at night that wakes you up from your sleep:     Blood clot in your veins:    Leg swelling:         Pulmonary    Oxygen at home:    Productive cough:     Wheezing:         Neurologic    Sudden weakness in arms or legs:     Sudden numbness in arms or legs:     Sudden onset of difficulty speaking or slurred speech:    Temporary loss of vision in one eye:     Problems with dizziness:         Gastrointestinal    Blood in stool:     Vomited blood:         Genitourinary    Burning when urinating:     Blood in urine:        Psychiatric    Major  depression:         Hematologic    Bleeding problems:    Problems with blood clotting too easily:        Skin    Rashes or ulcers:        Constitutional    Fever or chills:      PHYSICAL EXAM:   There were no vitals filed for this visit.  GENERAL: The patient is a well-nourished male, in no acute distress. The vital signs are documented above. CARDIAC: There is a regular rate and rhythm.  PULMONARY: Non-labored respirations MUSCULOSKELETAL: There are no major deformities or cyanosis. NEUROLOGIC: No focal weakness or paresthesias are detected. SKIN: There are no ulcers or rashes noted. PSYCHIATRIC: The patient has a normal affect.  STUDIES:   None  MEDICAL ISSUES:   Claudication: The patient has had improvement in the calf cramping that he experienced with exercise, since we started him on cilostazol.  However, he has had to miss work because of hip pain.  I am concerned that this may be arterial in origin or possibly secondary to arthritis.  I think the best way to answer this question is to proceed with angiography.  His most recent ultrasound does show biphasic waveforms in his femoral artery, therefore think this could potentially be arterial insufficiency.  I'll plan on a left femoral cannulation with aortogram and bilateral runoff.  I told the patient that I would only consider treating a iliac lesion, which would address his hip pain.  The calf claudication symptoms were improved with cilostazol, so I would like to image disease but I will not intervene on his superficial femoral arteries.  This been scheduled for November 6.  I have told the patient like for him to continue taking a baby aspirin.  The patient tells me he has recently started on a medicine for his cholesterol.  I assume this was a statin.   Durene CalWells Fransheska Willingham, MD [facility] Tel [phone] Pager [phone]

## 2017-05-20 NOTE — H&P (View-Only) (Signed)
[facility] and [facility]ist of [facility]  Patient name: [name] Ross MRN: [id number] DOB: 05/13/1961 Sex: male   REASON FOR VISIT:    Follow up  HISOTRY OF PRESENT ILLNESS:    [name] Ross is a 56 y.o. male, who is Referred today for evaluation of bilateral claudication.  The patient states that he has been having trouble for approximately 8 months.  He can walk approximately 100 feet.  At that time he starts getting pain in either his hip or his calf.  His right leg bothers him more than the left.  Stopping and resting causes his symptoms to go away.  He denies any nonhealing wounds.  He denies rest pain.  The patient is a current smoker.  He is undergone wedge resection in the recent past for lung nodule which turned out to be benign.  He does suffer from COPD.  We started a medical treatment program consisting of exercise and smoking cessation as well as cilostazol.  He is back today for follow-up.  He states that his calf pain has essentially gone away with the addition of cilostazol.  He has had to miss work because of pain in his right hip.  PAST MEDICAL HISTORY:   Past Medical History:  Diagnosis Date  . Back pain   . Chronic bronchitis (HCC)   . COPD (chronic obstructive pulmonary disease) (HCC)   . GERD (gastroesophageal reflux disease)   . Multiple lung nodules   . Neuromuscular disorder (HCC)    leg- pain causing a reduced stamina for walking , due to leg pain.   . Tobacco abuse      FAMILY HISTORY:   No family history on file.  SOCIAL HISTORY:   Social History  Substance Use Topics  . Smoking status: Current Every Day Smoker    Packs/day: 0.50    Years: 30.00  . Smokeless tobacco: Never Used  . Alcohol use Yes     Comment: daily- beer, 3-4 /day      ALLERGIES:   Allergies  Allergen Reactions  . Codeine Hives  . Demerol [Meperidine] Hives     CURRENT MEDICATIONS:   Current Outpatient Prescriptions   Medication Sig Dispense Refill  . acetaminophen (TYLENOL) 500 MG tablet Take 1,000 mg by mouth every 6 (six) hours as needed.    . Aspirin-Acetaminophen-Caffeine (GOODY HEADACHE PO) Take 1 packet by mouth daily as needed (headache, pain).    Marland Kitchen azelastine (ASTELIN) 0.1 % nasal spray Place 2 sprays into both nostrils 2 (two) times daily.  11  . cilostazol (PLETAL) 100 MG tablet Take 1 tablet (100 mg total) by mouth 2 (two) times daily before a meal. 60 tablet 11  . cyclobenzaprine (FLEXERIL) 5 MG tablet Take 1 tablet (5 mg total) by mouth 3 (three) times daily as needed for muscle spasms. (Patient not taking: Reported on 02/28/2017) 10 tablet 0  . losartan (COZAAR) 100 MG tablet Take 100 mg by mouth daily.  3  . omeprazole (PRILOSEC OTC) 20 MG tablet Take 20 mg by mouth daily.    . traMADol (ULTRAM) 50 MG tablet Take 1 tablet (50 mg total) by mouth every 6 (six) hours as needed for severe pain. (Patient not taking: Reported on 02/28/2017) 40 tablet 0   No current facility-administered medications for this visit.     REVIEW OF SYSTEMS:   [X]  denotes positive finding, [ ]  denotes negative finding Cardiac  Comments:  Chest pain or chest pressure:    Shortness of  breath upon exertion:    Short of breath when lying flat:    Irregular heart rhythm:        Vascular    Pain in calf, thigh, or hip brought on by ambulation: x   Pain in feet at night that wakes you up from your sleep:     Blood clot in your veins:    Leg swelling:         Pulmonary    Oxygen at home:    Productive cough:     Wheezing:         Neurologic    Sudden weakness in arms or legs:     Sudden numbness in arms or legs:     Sudden onset of difficulty speaking or slurred speech:    Temporary loss of vision in one eye:     Problems with dizziness:         Gastrointestinal    Blood in stool:     Vomited blood:         Genitourinary    Burning when urinating:     Blood in urine:        Psychiatric    Major  depression:         Hematologic    Bleeding problems:    Problems with blood clotting too easily:        Skin    Rashes or ulcers:        Constitutional    Fever or chills:      PHYSICAL EXAM:   There were no vitals filed for this visit.  GENERAL: The patient is a well-nourished male, in no acute distress. The vital signs are documented above. CARDIAC: There is a regular rate and rhythm.  PULMONARY: Non-labored respirations MUSCULOSKELETAL: There are no major deformities or cyanosis. NEUROLOGIC: No focal weakness or paresthesias are detected. SKIN: There are no ulcers or rashes noted. PSYCHIATRIC: The patient has a normal affect.  STUDIES:   None  MEDICAL ISSUES:   Claudication: The patient has had improvement in the calf cramping that he experienced with exercise, since we started him on cilostazol.  However, he has had to miss work because of hip pain.  I am concerned that this may be arterial in origin or possibly secondary to arthritis.  I think the best way to answer this question is to proceed with angiography.  His most recent ultrasound does show biphasic waveforms in his femoral artery, therefore think this could potentially be arterial insufficiency.  I'll plan on a left femoral cannulation with aortogram and bilateral runoff.  I told the patient that I would only consider treating a iliac lesion, which would address his hip pain.  The calf claudication symptoms were improved with cilostazol, so I would like to image disease but I will not intervene on his superficial femoral arteries.  This been scheduled for November 6.  I have told the patient like for him to continue taking a baby aspirin.  The patient tells me he has recently started on a medicine for his cholesterol.  I assume this was a statin.   Durene CalWells Carmello Cabiness, MD [facility] Tel [phone] Pager [phone]

## 2017-05-20 NOTE — Progress Notes (Signed)
Vitals:   05/20/17 0921  BP: (!) 171/88  Pulse: (!) 106  Resp: 16  Temp: 98.2 F (36.8 C)  SpO2: 98%  Weight: 148 lb (67.1 kg)  Height: 5\' 11"  (1.803 m)

## 2017-05-28 ENCOUNTER — Ambulatory Visit (HOSPITAL_COMMUNITY)
Admission: RE | Admit: 2017-05-28 | Discharge: 2017-05-28 | Disposition: A | Discharge status: Home / Self Care | Payer: BLUE CROSS/BLUE SHIELD | Source: Ambulatory Visit | Attending: Surgery | Admitting: Surgery

## 2017-05-28 ENCOUNTER — Encounter (HOSPITAL_COMMUNITY)
Admission: RE | Disposition: A | Discharge status: Home / Self Care | Payer: Self-pay | Source: Ambulatory Visit | Attending: Surgery

## 2017-05-28 DIAGNOSIS — K219 Gastro-esophageal reflux disease without esophagitis: Secondary | ICD-10-CM | POA: Insufficient documentation

## 2017-05-28 DIAGNOSIS — J449 Chronic obstructive pulmonary disease, unspecified: Secondary | ICD-10-CM | POA: Diagnosis not present

## 2017-05-28 DIAGNOSIS — I739 Peripheral vascular disease, unspecified: Secondary | ICD-10-CM | POA: Diagnosis not present

## 2017-05-28 DIAGNOSIS — I70213 Atherosclerosis of native arteries of extremities with intermittent claudication, bilateral legs: Secondary | ICD-10-CM | POA: Diagnosis not present

## 2017-05-28 DIAGNOSIS — F17210 Nicotine dependence, cigarettes, uncomplicated: Secondary | ICD-10-CM | POA: Diagnosis not present

## 2017-05-28 DIAGNOSIS — I70211 Atherosclerosis of native arteries of extremities with intermittent claudication, right leg: Secondary | ICD-10-CM | POA: Diagnosis not present

## 2017-05-28 HISTORY — PX: ABDOMINAL AORTOGRAM: CATH118222

## 2017-05-28 HISTORY — PX: PERIPHERAL VASCULAR INTERVENTION: CATH118257

## 2017-05-28 HISTORY — PX: LOWER EXTREMITY ANGIOGRAPHY: CATH118251

## 2017-05-28 LAB — POCT I-STAT, CHEM 8
BUN: 12 mg/dL (ref 6–20)
CALCIUM ION: 1.23 mmol/L (ref 1.15–1.40)
CREATININE: 0.8 mg/dL (ref 0.61–1.24)
Chloride: 101 mmol/L (ref 101–111)
Glucose, Bld: 106 mg/dL — ABNORMAL HIGH (ref 65–99)
HCT: 35 % — ABNORMAL LOW (ref 39.0–52.0)
Hemoglobin: 11.9 g/dL — ABNORMAL LOW (ref 13.0–17.0)
Potassium: 4.1 mmol/L (ref 3.5–5.1)
Sodium: 133 mmol/L — ABNORMAL LOW (ref 135–145)
TCO2: 25 mmol/L (ref 22–32)

## 2017-05-28 LAB — POCT ACTIVATED CLOTTING TIME
ACTIVATED CLOTTING TIME: 208 s
Activated Clotting Time: 180 seconds

## 2017-05-28 SURGERY — ABDOMINAL AORTOGRAM
Anesthesia: LOCAL | Laterality: Right

## 2017-05-28 MED ORDER — HEPARIN (PORCINE) IN NACL 2-0.9 UNIT/ML-% IJ SOLN
INTRAMUSCULAR | Status: AC
  Filled 2017-05-28: qty 500

## 2017-05-28 MED ORDER — SODIUM CHLORIDE 0.9 % IV SOLN: 250.0000 mL | INTRAVENOUS | Status: DC | PRN

## 2017-05-28 MED ORDER — SODIUM CHLORIDE 0.9% FLUSH: 3.0000 mL | Freq: Two times a day (BID) | INTRAVENOUS | Status: DC

## 2017-05-28 MED ORDER — HYDROCODONE-ACETAMINOPHEN 5-325 MG PO TABS
2.0000 | ORAL_TABLET | Freq: Once | ORAL | Status: AC
  Administered 2017-05-28: 2 via ORAL
  Filled 2017-05-28: qty 2

## 2017-05-28 MED ORDER — LIDOCAINE HCL (PF) 1 % IJ SOLN
INTRAMUSCULAR | Status: DC | PRN
  Administered 2017-05-28: 12 mL

## 2017-05-28 MED ORDER — HYDRALAZINE HCL 20 MG/ML IJ SOLN: 5.0000 mg | INTRAMUSCULAR | Status: DC | PRN

## 2017-05-28 MED ORDER — HEPARIN SODIUM (PORCINE) 1000 UNIT/ML IJ SOLN
INTRAMUSCULAR | Status: AC
  Filled 2017-05-28: qty 1

## 2017-05-28 MED ORDER — HYDROCODONE-ACETAMINOPHEN 5-325 MG PO TABS
ORAL_TABLET | ORAL | Status: AC
  Administered 2017-05-28: 2 via ORAL
  Filled 2017-05-28: qty 2

## 2017-05-28 MED ORDER — FENTANYL CITRATE (PF) 100 MCG/2ML IJ SOLN
INTRAMUSCULAR | Status: DC | PRN
  Administered 2017-05-28 (×2): 25 ug via INTRAVENOUS

## 2017-05-28 MED ORDER — MORPHINE SULFATE (PF) 2 MG/ML IV SOLN
2.0000 mg | INTRAVENOUS | Status: DC | PRN
  Administered 2017-05-28: 2 mg via INTRAVENOUS

## 2017-05-28 MED ORDER — IODIXANOL 320 MG/ML IV SOLN
INTRAVENOUS | Status: DC | PRN
  Administered 2017-05-28: 160 mL via INTRAVENOUS

## 2017-05-28 MED ORDER — FENTANYL CITRATE (PF) 100 MCG/2ML IJ SOLN
INTRAMUSCULAR | Status: AC
  Filled 2017-05-28: qty 2

## 2017-05-28 MED ORDER — MIDAZOLAM HCL 2 MG/2ML IJ SOLN
INTRAMUSCULAR | Status: AC
  Filled 2017-05-28: qty 2

## 2017-05-28 MED ORDER — LABETALOL HCL 5 MG/ML IV SOLN: 10.0000 mg | INTRAVENOUS | Status: DC | PRN

## 2017-05-28 MED ORDER — SODIUM CHLORIDE 0.9 % WEIGHT BASED INFUSION: 1.0000 mL/kg/h | INTRAVENOUS | Status: DC

## 2017-05-28 MED ORDER — MORPHINE SULFATE (PF) 4 MG/ML IV SOLN
INTRAVENOUS | Status: AC
  Filled 2017-05-28: qty 1

## 2017-05-28 MED ORDER — CLOPIDOGREL BISULFATE 75 MG PO TABS: 75.0000 mg | ORAL_TABLET | Freq: Every day | ORAL | 11 refills | Status: DC

## 2017-05-28 MED ORDER — SODIUM CHLORIDE 0.9 % IV SOLN
INTRAVENOUS | Status: DC
  Administered 2017-05-28: 07:00:00 via INTRAVENOUS

## 2017-05-28 MED ORDER — HEPARIN (PORCINE) IN NACL 2-0.9 UNIT/ML-% IJ SOLN
INTRAMUSCULAR | Status: AC | PRN
  Administered 2017-05-28: 1000 mL

## 2017-05-28 MED ORDER — LIDOCAINE HCL (PF) 1 % IJ SOLN
INTRAMUSCULAR | Status: AC
  Filled 2017-05-28: qty 30

## 2017-05-28 MED ORDER — CLOPIDOGREL BISULFATE 75 MG PO TABS: 75.0000 mg | ORAL_TABLET | Freq: Every day | ORAL | Status: DC

## 2017-05-28 MED ORDER — SODIUM CHLORIDE 0.9% FLUSH: 3.0000 mL | INTRAVENOUS | Status: DC | PRN

## 2017-05-28 MED ORDER — MIDAZOLAM HCL 2 MG/2ML IJ SOLN
INTRAMUSCULAR | Status: DC | PRN
  Administered 2017-05-28 (×2): 1 mg via INTRAVENOUS

## 2017-05-28 MED ORDER — HEPARIN SODIUM (PORCINE) 1000 UNIT/ML IJ SOLN
INTRAMUSCULAR | Status: DC | PRN
  Administered 2017-05-28: 7000 [IU] via INTRAVENOUS

## 2017-05-28 SURGICAL SUPPLY — 19 items
BALLN ARMADA 8X20X80 (BALLOONS) ×4
BALLOON ARMADA 8X20X80 (BALLOONS) ×3 IMPLANT
CATH OMNI FLUSH 5F 65CM (CATHETERS) ×4 IMPLANT
COVER PRB 48X5XTLSCP FOLD TPE (BAG) ×3 IMPLANT
COVER PROBE 5X48 (BAG) ×1
DEVICE CONTINUOUS FLUSH (MISCELLANEOUS) ×4 IMPLANT
DEVICE TORQUE H2O (MISCELLANEOUS) ×4 IMPLANT
GUIDEWIRE ANGLED .035X150CM (WIRE) ×4 IMPLANT
KIT ENCORE 26 ADVANTAGE (KITS) ×4 IMPLANT
KIT MICROINTRODUCER STIFF 5F (SHEATH) ×4 IMPLANT
KIT PV (KITS) ×4 IMPLANT
SHEATH FLEXOR ANSEL 1 7F 45CM (SHEATH) ×4 IMPLANT
SHEATH PINNACLE 5F 10CM (SHEATH) ×4 IMPLANT
STENT ABSOLUTE 9X30X135 (Permanent Stent) ×4 IMPLANT
SYR MEDRAD MARK V 150ML (SYRINGE) ×4 IMPLANT
TRANSDUCER W/STOPCOCK (MISCELLANEOUS) ×4 IMPLANT
TRAY PV CATH (CUSTOM PROCEDURE TRAY) ×4 IMPLANT
WIRE BENTSON .035X145CM (WIRE) ×4 IMPLANT
WIRE HI TORQ VERSACORE J 260CM (WIRE) ×4 IMPLANT

## 2017-05-28 NOTE — Interval H&P Note (Signed)
History and Physical Interval Note:  05/28/2017 6:51 AM  Irwin Brakemanoger D Philbrick  has presented today for surgery, with the diagnosis of pvd  The various methods of treatment have been discussed with the patient and family. After consideration of risks, benefits and other options for treatment, the patient has consented to  Procedure(s): ABDOMINAL AORTOGRAM W/LOWER EXTREMITY (N/A) as a surgical intervention .  The patient's history has been reviewed, patient examined, no change in status, stable for surgery.  I have reviewed the patient's chart and labs.  Questions were answered to the patient's satisfaction.     Durene CalBrabham, Wells

## 2017-05-28 NOTE — Discharge Instructions (Signed)

## 2017-05-28 NOTE — Op Note (Signed)
Patient name: [name] BrakemanRoger D Ross MRN: [id number] DOB: 02/23/1961 Sex: male  05/28/2017 Pre-operative Diagnosis: Right hip claudication Post-operative diagnosis:  Same Surgeon:  Durene CalBrabham, Wells Procedure Performed:  1.  Ultrasound-guided access, left femoral artery  2.  Abdominal aortogram  3.  Bilateral lower extremity runoff  4.  Stent, right common iliac  5.  Stent, right external iliac  6.  Conscious sedation (44 minutes)    Indications:  The patient suffers from bilateral claudication.  He was started on cilostazol with significant improvement in his calf symptoms, however his hip pain persisted.  He comes in today for further evaluation  Procedure:  The patient was identified in the holding area and taken to room 8.  The patient was then placed supine on the table and prepped and draped in the usual sterile fashion.  A time out was called.  Conscious sedation was administered with the use of IV fentanyl and Versed in a continuous physician and nurse monitoring.  Heart rate, blood pressure, and oxygen saturation were continuously monitored.  Ultrasound was used to evaluate the left common femoral artery.  It was patent .  A digital ultrasound image was acquired.  A micropuncture needle was used to access the left common femoral artery under ultrasound guidance.  An 018 wire was advanced without resistance and a micropuncture sheath was placed.  The 018 wire was removed and a benson wire was placed.  The micropuncture sheath was exchanged for a 5 french sheath.  An omniflush catheter was advanced over the wire to the level of L-1.  An abdominal angiogram was obtained.  Next, the catheter was pulled down to the aortic bifurcation and pelvic oblique imaging was performed followed by bilateral runoff.  Findings:   Aortogram:  No significant renal artery stenosis.  The infrarenal abdominal aorta is patent throughout it's course.  The left common and external iliac artery are widely patent.  The  left hypogastric artery is occluded.  The right common iliac artery is patent throughout it's course.  There is a high-grade stenosis at the origin of the right internal iliac artery and there is near occlusion of the proximal right external iliac artery.  Right Lower Extremity:  The right common femoral and profunda femoral artery are patent throughout their course.  The superficial femoral artery is occluded with reconstitution of the above-knee popliteal artery and three-vessel runoff  Left Lower Extremity:  The left common femoral and profunda femoral artery are patent throughout their course.  The superficial femoral artery is occluded with reconstitution of the above-knee popliteal artery and three-vessel runoff  Intervention:  After the above imaging was acquired the decision was made to proceed with intervention.  Using a Glidewire and a Omni flush catheter the aortic bifurcation and lesion were crossed.  A 035 wire was inserted.  Next a 7 French 45 cm sheath was advanced into the right external iliac artery.  The patient was fully heparinized.  I elected to primarily stented this lesion.  A 9 x 30 self-expanding Abbott stent was placed into the right external and right common iliac artery and then deployed.  It was postdilated with an 8 mm balloon.  Completion imaging revealed complete resolution of the stenosis.  The sheath was then withdrawn to the left external iliac artery and the wire was removed.  The patient will be taken the holding area for sheath pull once his coagulative profile corrects.  Impression:  #1  successful primary stenting of a right iliac  near total occlusion using a 9 x 30 self-expanding stent  #2  bilateral superficial femoral artery occlusion   V. Durene CalWells Toniya Rozar, M.D. [facility] Office: [phone] Pager:  [phone]

## 2017-05-28 NOTE — Progress Notes (Signed)
Patients 7 fr left femoral arterial sheath was pulled per order. Manual pressure was held for 20 minutes. Patient tolerated pull well. Sheath site during and post sheath pull was clean, dry, intact and had no sign of a hematoma. Patients VS remained WNL's during and post sheath pull. Patients bedrest began at 0950 and ends in 4 fours at 1350. Vascular site assessment was a level 0. Sheath removal site was dressed with a gauze dressing and was clean, dry, and intact. Patient was educated about post sheath pull management and stated he understood and had no questions.

## 2017-05-29 ENCOUNTER — Emergency Department (HOSPITAL_COMMUNITY)
Admission: EM | Admit: 2017-05-29 | Discharge: 2017-05-30 | Disposition: A | Discharge status: Home / Self Care | Payer: BLUE CROSS/BLUE SHIELD | Attending: Emergency Medicine | Admitting: Emergency Medicine

## 2017-05-29 ENCOUNTER — Encounter (HOSPITAL_COMMUNITY): Payer: Self-pay | Admitting: Surgery

## 2017-05-29 ENCOUNTER — Other Ambulatory Visit: Payer: Self-pay

## 2017-05-29 DIAGNOSIS — Y939 Activity, unspecified: Secondary | ICD-10-CM | POA: Diagnosis not present

## 2017-05-29 DIAGNOSIS — Y929 Unspecified place or not applicable: Secondary | ICD-10-CM | POA: Diagnosis not present

## 2017-05-29 DIAGNOSIS — G8918 Other acute postprocedural pain: Secondary | ICD-10-CM | POA: Diagnosis not present

## 2017-05-29 DIAGNOSIS — S3022XA Contusion of scrotum and testes, initial encounter: Secondary | ICD-10-CM | POA: Diagnosis not present

## 2017-05-29 DIAGNOSIS — D649 Anemia, unspecified: Secondary | ICD-10-CM | POA: Insufficient documentation

## 2017-05-29 DIAGNOSIS — I739 Peripheral vascular disease, unspecified: Secondary | ICD-10-CM | POA: Insufficient documentation

## 2017-05-29 DIAGNOSIS — Y999 Unspecified external cause status: Secondary | ICD-10-CM | POA: Insufficient documentation

## 2017-05-29 DIAGNOSIS — X58XXXA Exposure to other specified factors, initial encounter: Secondary | ICD-10-CM | POA: Insufficient documentation

## 2017-05-29 DIAGNOSIS — N5082 Scrotal pain: Secondary | ICD-10-CM | POA: Diagnosis not present

## 2017-05-29 LAB — CBC WITH DIFFERENTIAL/PLATELET
BASOS ABS: 0 10*3/uL (ref 0.0–0.1)
Basophils Relative: 0 %
EOS PCT: 1 %
Eosinophils Absolute: 0.2 10*3/uL (ref 0.0–0.7)
HEMATOCRIT: 31.4 % — AB (ref 39.0–52.0)
Hemoglobin: 10.7 g/dL — ABNORMAL LOW (ref 13.0–17.0)
LYMPHS ABS: 1.9 10*3/uL (ref 0.7–4.0)
LYMPHS PCT: 15 %
MCH: 32.4 pg (ref 26.0–34.0)
MCHC: 34.1 g/dL (ref 30.0–36.0)
MCV: 95.2 fL (ref 78.0–100.0)
MONO ABS: 1.4 10*3/uL — AB (ref 0.1–1.0)
Monocytes Relative: 11 %
NEUTROS ABS: 8.9 10*3/uL — AB (ref 1.7–7.7)
Neutrophils Relative %: 73 %
Platelets: 388 10*3/uL (ref 150–400)
RBC: 3.3 MIL/uL — AB (ref 4.22–5.81)
RDW: 13 % (ref 11.5–15.5)
WBC: 12.4 10*3/uL — ABNORMAL HIGH (ref 4.0–10.5)

## 2017-05-29 LAB — COMPREHENSIVE METABOLIC PANEL
ALT: 17 U/L (ref 17–63)
AST: 17 U/L (ref 15–41)
Albumin: 4.1 g/dL (ref 3.5–5.0)
Alkaline Phosphatase: 77 U/L (ref 38–126)
Anion gap: 8 (ref 5–15)
BILIRUBIN TOTAL: 0.4 mg/dL (ref 0.3–1.2)
BUN: 10 mg/dL (ref 6–20)
CO2: 24 mmol/L (ref 22–32)
CREATININE: 0.97 mg/dL (ref 0.61–1.24)
Calcium: 9.3 mg/dL (ref 8.9–10.3)
Chloride: 100 mmol/L — ABNORMAL LOW (ref 101–111)
Glucose, Bld: 114 mg/dL — ABNORMAL HIGH (ref 65–99)
Potassium: 3.9 mmol/L (ref 3.5–5.1)
Sodium: 132 mmol/L — ABNORMAL LOW (ref 135–145)
TOTAL PROTEIN: 6.5 g/dL (ref 6.5–8.1)

## 2017-05-29 MED ORDER — HYDROCODONE-ACETAMINOPHEN 5-325 MG PO TABS
1.0000 | ORAL_TABLET | Freq: Once | ORAL | Status: AC
  Administered 2017-05-30: 1 via ORAL
  Filled 2017-05-29: qty 1

## 2017-05-29 NOTE — ED Triage Notes (Signed)
PT had R iliac stent placed yesterday, outpatient.  Throughout the night pt is experiencing increasing testicular/inguinal bruising and pain 101/10.

## 2017-05-29 NOTE — ED Notes (Signed)
Patient moved to triage room for privacy - patient states the bleeding has worsened since he's been here. Dr. Adriana Simasook to assess.

## 2017-05-29 NOTE — ED Provider Notes (Signed)
[facility] Provider Note   CSN: [id number] Arrival date & time: 05/29/17  1826     History   Chief Complaint Chief Complaint  Patient presents with  . Post-op Problem    HPI [name] Ross is a 56 y.o. male.  The history is provided by the patient.  He comes in because of scrotal discoloration and pain.  He had a stent placed in his right common and external iliac arteries done yesterday.  Procedure was done through a left femoral artery puncture.  Today, he has noted some discoloration and swelling in the scrotum and pain in the same area.  He rates pain at 10/10.  He has been taking acetaminophen without relief.  Past Medical History:  Diagnosis Date  . Back pain   . Chronic bronchitis (HCC)   . COPD (chronic obstructive pulmonary disease) (HCC)   . GERD (gastroesophageal reflux disease)   . Multiple lung nodules   . Neuromuscular disorder (HCC)    leg- pain causing a reduced stamina for walking , due to leg pain.   . Tobacco abuse     Patient Active Problem List   Diagnosis Date Noted  . Encounter for chest tube removal   . Pneumothorax   . Postop check   . Acid fast bacillus   . Smoker   . Chronic bronchitis (HCC)   . S/P partial lobectomy of lung 10/29/2016  . Nodule of right lung 10/25/2016  . COPD (chronic obstructive pulmonary disease) with emphysema (HCC) 10/25/2016    Past Surgical History:  Procedure Laterality Date  . APPENDECTOMY  1980  . BACK SURGERY  1980's       Home Medications    Prior to Admission medications   Medication Sig Start Date End Date Taking? Authorizing Provider  acetaminophen (TYLENOL) 500 MG tablet Take 1,000 mg by mouth every 6 (six) hours as needed (for pain.).    Yes [provider]  azelastine (ASTELIN) 0.1 % nasal spray Place 2 sprays into both nostrils 2 (two) times daily as needed for allergies.  09/28/16  Yes [provider]  cilostazol (PLETAL) 100 MG  tablet Take 1 tablet (100 mg total) by mouth 2 (two) times daily before a meal. 02/14/17  Yes Nada LibmanBrabham, Vance W, MD  clopidogrel (PLAVIX) 75 MG tablet Take 1 tablet (75 mg total) daily by mouth. 05/28/17  Yes Nada LibmanBrabham, Vance W, MD  olmesartan (BENICAR) 40 MG tablet Take 40 mg by mouth daily. 04/29/17  Yes [provider]  ranitidine (ZANTAC) 300 MG tablet Take 300 mg by mouth daily. 03/05/17  Yes [provider]  rosuvastatin (CRESTOR) 5 MG tablet Take 5 mg by mouth daily. 05/13/17  Yes [provider]  tiZANidine (ZANAFLEX) 4 MG tablet Take 4 mg by mouth two times a day as needed for muscle spasms 05/13/17  Yes [provider]    Family History No family history on file.  Social History Social History   Tobacco Use  . Smoking status: Current Every Day Smoker    Packs/day: 0.50    Years: 30.00    Pack years: 15.00  . Smokeless tobacco: Never Used  Substance Use Topics  . Alcohol use: Yes    Comment: daily- beer, 3-4 /day   . Drug use: No     Allergies   Codeine and Demerol [meperidine]   Review of Systems Review of Systems  All other systems reviewed and are negative.    Physical Exam  Updated Vital Signs BP 125/60 (BP Location: Left Arm)   Pulse 80   Temp (!) 97.5 F (36.4 C) (Oral)   Resp 14   Ht 5\' 11"  (1.803 m)   Wt 70.3 kg (155 lb)   SpO2 100%   BMI 21.62 kg/m   Physical Exam  Nursing note and vitals reviewed.  56 year old male, resting comfortably and in no acute distress. Vital signs are normal. Oxygen saturation is 100%, which is normal. Head is normocephalic and atraumatic. PERRLA, EOMI. Oropharynx is clear. Neck is nontender and supple without adenopathy or JVD. Back is nontender and there is no CVA tenderness. Lungs are clear without rales, wheezes, or rhonchi. Chest is nontender. Heart has regular rate and rhythm without murmur. Abdomen is soft, flat, nontender without masses or hepatosplenomegaly and peristalsis is  normoactive. Genitalia: Circumcised penis.  Ecchymosis noted in the right side of the scrotum without obvious hematoma.  Moderate tenderness over the same area.  Dressing is present over femoral artery puncture site without any bleeding through the dressing.  No ecchymosis noted in the thigh. Extremities have no cyanosis or edema, full range of motion is present. Skin is warm and dry without rash. Neurologic: Mental status is normal, cranial nerves are intact, there are no motor or sensory deficits.  ED Treatments / Results  Labs (all labs ordered are listed, but only abnormal results are displayed) Labs Reviewed  CBC WITH DIFFERENTIAL/PLATELET - Abnormal; Notable for the following components:      Result Value   WBC 12.4 (*)    RBC 3.30 (*)    Hemoglobin 10.7 (*)    HCT 31.4 (*)    Neutro Abs 8.9 (*)    Monocytes Absolute 1.4 (*)    All other components within normal limits  COMPREHENSIVE METABOLIC PANEL - Abnormal; Notable for the following components:   Sodium 132 (*)    Chloride 100 (*)    Glucose, Bld 114 (*)    All other components within normal limits  CBC     Procedures Procedures (including critical care time)  Medications Ordered in ED Medications  HYDROcodone-acetaminophen (NORCO/VICODIN) 5-325 MG per tablet 1 tablet (not administered)     Initial Impression / Assessment and Plan / ED Course  I have reviewed the triage vital signs and the nursing notes.  Pertinent lab results that were available during my care of the patient were reviewed by me and considered in my medical decision making (see chart for details).  Scrotal hematoma following left femoral artery catheterization.  No evidence of ongoing bleeding at this time.  Vital signs are normal.  Will check hemoglobin to see if there has been significant blood loss.  He is given hydrocodone-acetaminophen for pain.  Hemoglobin is stable.  He is discharged with prescription for hydrocodone-acetaminophen.   Advised to continue to apply ice.  Follow-up with his vascular surgeon.  Final Clinical Impressions(s) / ED Diagnoses   Final diagnoses:  Post-operative pain  Scrotal hematoma  Normochromic normocytic anemia  PVD (peripheral vascular disease) [facility](HCC)    ED Discharge Orders        Ordered    HYDROcodone-acetaminophen (NORCO) 5-325 MG tablet  Every 4 hours PRN     05/30/17 0049       Dione BoozeGlick, Aimy Sweeting, MD 05/30/17 0050

## 2017-05-30 ENCOUNTER — Telehealth: Payer: Self-pay | Admitting: *Deleted

## 2017-05-30 ENCOUNTER — Telehealth: Payer: Self-pay | Admitting: Surgery

## 2017-05-30 MED ORDER — HYDROCODONE-ACETAMINOPHEN 5-325 MG PO TABS: 1.0000 | ORAL_TABLET | ORAL | 0 refills | Status: DC | PRN

## 2017-05-30 NOTE — Addendum Note (Signed)
Addended by: Burton ApleyPETTY, Makhi Muzquiz A on: 05/30/2017 03:13 PM   Modules accepted: Orders

## 2017-05-30 NOTE — Discharge Instructions (Signed)
Apply ice several times a day. Return if pain is not being adequately controlled at home.

## 2017-05-30 NOTE — Telephone Encounter (Signed)
-----   Message from Sharee PimpleMarilyn K McChesney, RN sent at 05/28/2017 10:38 AM EST ----- Regarding: 1 month w/ lab and see [facility]   ----- Message ----- From: Nada LibmanBrabham, Vance W, MD Sent: 05/28/2017   8:34 AM To: [facility] Charge Pool  11 -12-2016:  Surgeon:  Durene CalBrabham, [place] Procedure Performed:  1.  Ultrasound-guided access, left femoral artery  2.  Abdominal aortogram  3.  Bilateral lower extremity runoff  4.  Stent, right common iliac  5.  Stent, right external iliac  6.  Conscious sedation (44 minutes)  Follow-up one month with aortoiliac duplex and ABIs to see me

## 2017-05-30 NOTE — Telephone Encounter (Signed)
Sched appt 07/29/17 by of lab availability; labs at 8:00 and MD at 9:15. Lm on hm#.

## 2017-05-30 NOTE — Telephone Encounter (Signed)
Call from patient's wife states" I want someone to check his leg". Went to ER last night due to acute  pain and swelling and light oozing of blood at right groin. States today swelling is down some, dressing was not removed or changed by ER department last pm. She states CMS is good in right leg and color and temp is improved since procedure. Sched. Appointment with Rosalita ChessmanSuzanne for wound check 05/31/17 for wound check but instructed to go to ER if symptoms worsen or in acute distress.

## 2017-05-31 ENCOUNTER — Ambulatory Visit (HOSPITAL_COMMUNITY)
Admission: RE | Admit: 2017-05-31 | Discharge: 2017-05-31 | Disposition: A | Discharge status: Home / Self Care | Payer: BLUE CROSS/BLUE SHIELD | Source: Ambulatory Visit | Attending: Family | Admitting: Family

## 2017-05-31 ENCOUNTER — Ambulatory Visit (INDEPENDENT_AMBULATORY_CARE_PROVIDER_SITE_OTHER): Payer: BLUE CROSS/BLUE SHIELD | Admitting: Family

## 2017-05-31 ENCOUNTER — Other Ambulatory Visit: Payer: Self-pay | Admitting: Cardiothoracic Surgery

## 2017-05-31 ENCOUNTER — Encounter: Payer: Self-pay | Admitting: Family

## 2017-05-31 VITALS — BP 118/68 | HR 104 | Temp 98.3°F | Resp 20 | Ht 71.0 in | Wt 155.0 lb

## 2017-05-31 DIAGNOSIS — R1032 Left lower quadrant pain: Secondary | ICD-10-CM | POA: Diagnosis not present

## 2017-05-31 DIAGNOSIS — Z87891 Personal history of nicotine dependence: Secondary | ICD-10-CM

## 2017-05-31 DIAGNOSIS — R1904 Left lower quadrant abdominal swelling, mass and lump: Secondary | ICD-10-CM | POA: Diagnosis not present

## 2017-05-31 DIAGNOSIS — G8918 Other acute postprocedural pain: Secondary | ICD-10-CM

## 2017-05-31 DIAGNOSIS — I70213 Atherosclerosis of native arteries of extremities with intermittent claudication, bilateral legs: Secondary | ICD-10-CM

## 2017-05-31 DIAGNOSIS — Z720 Tobacco use: Secondary | ICD-10-CM

## 2017-05-31 DIAGNOSIS — R600 Localized edema: Secondary | ICD-10-CM | POA: Diagnosis not present

## 2017-05-31 DIAGNOSIS — S301XXA Contusion of abdominal wall, initial encounter: Secondary | ICD-10-CM

## 2017-05-31 DIAGNOSIS — Z789 Other specified health status: Secondary | ICD-10-CM

## 2017-05-31 LAB — VAS US GROIN PSEUDOANEURYSM: LSFPPSV: -53 cm/s

## 2017-05-31 NOTE — Patient Instructions (Signed)
Steps to Quit Smoking Smoking tobacco can be bad for your health. It can also affect almost every organ in your body. Smoking puts you and people around you at risk for many serious long-lasting (chronic) diseases. Quitting smoking is hard, but it is one of the best things that you can do for your health. It is never too late to quit. What are the benefits of quitting smoking? When you quit smoking, you lower your risk for getting serious diseases and conditions. They can include:  Lung cancer or lung disease.  Heart disease.  Stroke.  Heart attack.  Not being able to have children (infertility).  Weak bones (osteoporosis) and broken bones (fractures).  If you have coughing, wheezing, and shortness of breath, those symptoms may get better when you quit. You may also get sick less often. If you are pregnant, quitting smoking can help to lower your chances of having a baby of low birth weight. What can I do to help me quit smoking? Talk with your doctor about what can help you quit smoking. Some things you can do (strategies) include:  Quitting smoking totally, instead of slowly cutting back how much you smoke over a period of time.  Going to in-person counseling. You are more likely to quit if you go to many counseling sessions.  Using resources and support systems, such as: ? Online chats with a counselor. ? Phone quitlines. ? Printed self-help materials. ? Support groups or group counseling. ? Text messaging programs. ? Mobile phone apps or applications.  Taking medicines. Some of these medicines may have nicotine in them. If you are pregnant or breastfeeding, do not take any medicines to quit smoking unless your doctor says it is okay. Talk with your doctor about counseling or other things that can help you.  Talk with your doctor about using more than one strategy at the same time, such as taking medicines while you are also going to in-person counseling. This can help make  quitting easier. What things can I do to make it easier to quit? Quitting smoking might feel very hard at first, but there is a lot that you can do to make it easier. Take these steps:  Talk to your family and friends. Ask them to support and encourage you.  Call phone quitlines, reach out to support groups, or work with a counselor.  Ask people who smoke to not smoke around you.  Avoid places that make you want (trigger) to smoke, such as: ? Bars. ? Parties. ? Smoke-break areas at work.  Spend time with people who do not smoke.  Lower the stress in your life. Stress can make you want to smoke. Try these things to help your stress: ? Getting regular exercise. ? Deep-breathing exercises. ? Yoga. ? Meditating. ? Doing a body scan. To do this, close your eyes, focus on one area of your body at a time from head to toe, and notice which parts of your body are tense. Try to relax the muscles in those areas.  Download or buy apps on your mobile phone or tablet that can help you stick to your quit plan. There are many free apps, such as QuitGuide from the CDC (Centers for Disease Control and Prevention). You can find more support from smokefree.gov and other websites.  This information is not intended to replace advice given to you by your health care provider. Make sure you discuss any questions you have with your health care provider. Document Released: 05/05/2009 Document   Revised: 03/06/2016 Document Reviewed: 11/23/2014 Elsevier Interactive Patient Education  2018 Elsevier Inc.  

## 2017-05-31 NOTE — Progress Notes (Signed)
Postoperative Visit   History of Present Illness  [name] Ross is a 56 y.o. male who is s/p arteriogram with placement of stent in right common iliac artery and placement of stent in right external iliac artery on 05-28-17 by Dr. Myra Gianotti for right hip claudication.  The patient suffers from bilateral claudication.  He was started on cilostazol with significant improvement in his calf symptoms, however his hip pain persisted. Findings:              Aortogram:  No significant renal artery stenosis.  The infrarenal abdominal aorta is patent throughout it's course.  The left common and external iliac artery are widely patent.  The left hypogastric artery is occluded.  The right common iliac artery is patent throughout it's course.  There is a high-grade stenosis at the origin of the right internal iliac artery and there is near occlusion of the proximal right external iliac artery.             Right Lower Extremity:  The right common femoral and profunda femoral artery are patent throughout their course.  The superficial femoral artery is occluded with reconstitution of the above-knee popliteal artery and three-vessel runoff             Left Lower Extremity:  The left common femoral and profunda femoral artery are patent throughout their course.  The superficial femoral artery is occluded with reconstitution of the above-knee popliteal artery and three-vessel runoff   He returns today after received call yesterday from patient's wife states" I want someone to check his leg". Went to ER the previous night due to acute  pain and swelling and light oozing of blood at right groin. States today swelling is down some. Pt c/o pain and tenderness in his left groin and pubis that started on 05-28-17 after the arteriogram.  He denies fever or chills.  There is no open wound at his left groin or at his lower extremities.   Pt states the pain in his right hip has resolved since the right iliac artery  stenting.   He states he has stopped smoking, and is using a nicotine vapor product, and plans to wean off of this.  The patient is able to complete their activities of daily living.     For VQI Use Only  PRE-ADM LIVING: Home  AMB STATUS: Ambulatory    Past Medical History:  Diagnosis Date  . Back pain   . Chronic bronchitis (HCC)   . COPD (chronic obstructive pulmonary disease) (HCC)   . GERD (gastroesophageal reflux disease)   . Multiple lung nodules   . Neuromuscular disorder (HCC)    leg- pain causing a reduced stamina for walking , due to leg pain.   . Tobacco abuse     Past Surgical History:  Procedure Laterality Date  . APPENDECTOMY  1980  . BACK SURGERY  1980's    Social History   Socioeconomic History  . Marital status: Single    Spouse name: Not on file  . Number of children: Not on file  . Years of education: Not on file  . Highest education level: Not on file  Social Needs  . Financial resource strain: Not on file  . Food insecurity - worry: Not on file  . Food insecurity - inability: Not on file  . Transportation needs - medical: Not on file  . Transportation needs - non-medical: Not on file  Occupational History  . Not on file  Tobacco Use  . Smoking status: Current Every Day Smoker    Packs/day: 0.50    Years: 30.00    Pack years: 15.00  . Smokeless tobacco: Never Used  Substance and Sexual Activity  . Alcohol use: Yes    Comment: daily- beer, 3-4 /day   . Drug use: No  . Sexual activity: Not on file  Other Topics Concern  . Not on file  Social History Narrative  . Not on file    Allergies  Allergen Reactions  . Codeine Hives  . Demerol [Meperidine] Hives    Current Outpatient Medications on File Prior to Visit  Medication Sig Dispense Refill  . acetaminophen (TYLENOL) 500 MG tablet Take 1,000 mg by mouth every 6 (six) hours as needed (for pain.).     Marland Kitchen. azelastine (ASTELIN) 0.1 % nasal spray Place 2 sprays into both nostrils 2  (two) times daily as needed for allergies.   11  . cilostazol (PLETAL) 100 MG tablet Take 1 tablet (100 mg total) by mouth 2 (two) times daily before a meal. 60 tablet 11  . clopidogrel (PLAVIX) 75 MG tablet Take 1 tablet (75 mg total) daily by mouth. 30 tablet 11  . HYDROcodone-acetaminophen (NORCO) 5-325 MG tablet Take 1 tablet every 4 (four) hours as needed by mouth for moderate pain. 15 tablet 0  . olmesartan (BENICAR) 40 MG tablet Take 40 mg by mouth daily.  0  . ranitidine (ZANTAC) 300 MG tablet Take 300 mg by mouth daily.  3  . rosuvastatin (CRESTOR) 5 MG tablet Take 5 mg by mouth daily.  0  . tiZANidine (ZANAFLEX) 4 MG tablet Take 4 mg by mouth two times a day as needed for muscle spasms  0   No current facility-administered medications on file prior to visit.       Physical Examination  Vitals:   05/31/17 1504  BP: 118/68  Pulse: (!) 104  Resp: 20  Temp: 98.3 F (36.8 C)  TempSrc: Oral  SpO2: 98%  Weight: 155 lb (70.3 kg)  Height: 5\' 11"  (1.803 m)   Body mass index is 21.62 kg/m.  PHYSICAL EXAMINATION: General: The patient appears their stated age.   HEENT:  No gross abnormalities Pulmonary: Respirations are non-labored Abdomen: Soft and non-tender. Musculoskeletal: There are no major deformities.   Neurologic: No focal weakness or paresthesias are detected, Skin: There are no ulcer or rashes noted. Large ecchymotic area at left groin, scrotum, across pubis, tender to palpation. Hard tender mass at left upper pubis, likely representing a hematoma.  Psychiatric: The patient has normal affect. Cardiovascular: There is a regular rate and rhythm  Vascular: Vessel Right Left  Radial 2+Palpable 2+Palpable  Aorta Not palpable N/A  Femoral Palpable Palpable  Popliteal Not palpable Not palpable  PT Not  Palpable, + Doppler signal Not  Palpable, + Doppler signal  DP not Palpable, + Doppler signal Not  Palpable, + Doppler signal    Medical Decision Making  Irwin BrakemanRoger D  Lenhoff is a 56 y.o. male who presents s/p arteriogram with placement of stent in right common iliac artery and placement of stent in right external iliac artery on 05-28-17 by Dr. Myra GianottiBrabham for right hip claudication.  He has a hematoma at the left side of his pubis, slightly distal to left femoral access for arteriogram catheter, that is tender. There is a large area of ecchymosis at his left groin, across his pubis, and his scrotum.  Left groin duplex today demonstrated no pseudoaneurysm, did  demonstrate complex appearing mass in the left lower quadrant, most likely represents a hematoma.  He takes no anticoagulant medication, he does take one antiplatelet medication.   I advised pt that the hematoma will take some time for his body to absorb it, warm moist compresses several times/day may hasten this. He may alternate ibuprofen and acetaminophen for pain control: acetaminophen up to 1000 mg every 8 hours, ibuprofen up to 800 mg every 8 hours, if he is not allergic to either, and if he has no known kidney or liver problems.  LFT's and serum creatinine were normal on 05-29-17.  He should walk a little, several times/day.    Pt is scheduled to return on 07-29-17 with aortoiliac duplex, ABI's, and see Dr. Myra GianottiBrabham.  I advised him to notify us if his pain or swelling does not improve.   I discussed in depth with the patient the nature of atherosclerosis, and emphasized the importance of maximal medical management including strict control of blood pressure, blood glucose, and lipid levels, obtaining regular exercise, and cessation of smoking.  The patient is aware that without maximal medical management the underlying atherosclerotic disease process will progress, limiting the benefit of any interventions. The patient is currently on a statin.     Thank you for allowing us to participate in this patient's care.  Rivka Baune, Carma LairSUZANNE L, RN, MSN, FNP-C [facility] Office:  [phone]  05/31/2017, 3:10 PM  Clinic MD: Chen/Cain

## 2017-06-04 ENCOUNTER — Ambulatory Visit: Payer: BLUE CROSS/BLUE SHIELD | Admitting: Cardiothoracic Surgery

## 2017-06-04 ENCOUNTER — Other Ambulatory Visit: Payer: BLUE CROSS/BLUE SHIELD

## 2017-06-06 ENCOUNTER — Ambulatory Visit: Payer: BLUE CROSS/BLUE SHIELD | Admitting: Cardiothoracic Surgery

## 2017-06-06 ENCOUNTER — Other Ambulatory Visit: Payer: BLUE CROSS/BLUE SHIELD

## 2017-07-29 ENCOUNTER — Ambulatory Visit (INDEPENDENT_AMBULATORY_CARE_PROVIDER_SITE_OTHER)
Admission: RE | Admit: 2017-07-29 | Discharge: 2017-07-29 | Disposition: A | Discharge status: Home / Self Care | Payer: BLUE CROSS/BLUE SHIELD | Source: Ambulatory Visit | Attending: Surgery | Admitting: Surgery

## 2017-07-29 ENCOUNTER — Encounter: Payer: Self-pay | Admitting: Surgery

## 2017-07-29 ENCOUNTER — Ambulatory Visit (INDEPENDENT_AMBULATORY_CARE_PROVIDER_SITE_OTHER): Payer: Self-pay | Admitting: Surgery

## 2017-07-29 ENCOUNTER — Ambulatory Visit (HOSPITAL_COMMUNITY)
Admission: RE | Admit: 2017-07-29 | Discharge: 2017-07-29 | Disposition: A | Discharge status: Home / Self Care | Payer: BLUE CROSS/BLUE SHIELD | Source: Ambulatory Visit | Attending: Surgery | Admitting: Surgery

## 2017-07-29 VITALS — BP 162/98 | HR 95 | Temp 97.5°F | Resp 16 | Ht 71.0 in | Wt 153.0 lb

## 2017-07-29 DIAGNOSIS — I70213 Atherosclerosis of native arteries of extremities with intermittent claudication, bilateral legs: Secondary | ICD-10-CM

## 2017-07-29 DIAGNOSIS — E782 Mixed hyperlipidemia: Secondary | ICD-10-CM | POA: Diagnosis not present

## 2017-07-29 DIAGNOSIS — Z48812 Encounter for surgical aftercare following surgery on the circulatory system: Secondary | ICD-10-CM

## 2017-07-29 DIAGNOSIS — R040 Epistaxis: Secondary | ICD-10-CM | POA: Diagnosis not present

## 2017-07-29 DIAGNOSIS — I10 Essential (primary) hypertension: Secondary | ICD-10-CM | POA: Diagnosis not present

## 2017-07-29 DIAGNOSIS — Z6820 Body mass index (BMI) 20.0-20.9, adult: Secondary | ICD-10-CM | POA: Diagnosis not present

## 2017-07-29 NOTE — Progress Notes (Signed)
[facility]  Patient name: [name] Ross MRN: [id number] DOB: 05/09/1961 Sex: male   REASON FOR VISIT:    Follow up  HISOTRY OF PRESENT ILLNESS:    [name] Ross is a 57 y.o. male who initially self for bilateral claudication at 100 feet.  He was get pain in his hip and calf.  The right leg was more severe than the left.  We tried him on medical management with exercise therapy including cilostazol.  The calf pain essentially went away with the addition of cilostazol however he was still having trouble with right hip pain.  He underwent angiography on 05/28/2017 and was found to have high-grade lesion in the right external iliac and common iliac artery which were successfully stented.  The patient was seen postoperatively with ecchymosis and hematoma in the left groin.  Ultrasound was negative for pseudoaneurysm.  He comes in today stating that this has all resolved.  His hip pain has also resolved.   PAST MEDICAL HISTORY:   Past Medical History:  Diagnosis Date  . Back pain   . Chronic bronchitis (HCC)   . COPD (chronic obstructive pulmonary disease) (HCC)   . GERD (gastroesophageal reflux disease)   . Multiple lung nodules   . Neuromuscular disorder (HCC)    leg- pain causing a reduced stamina for walking , due to leg pain.   . Tobacco abuse      FAMILY HISTORY:   No family history on file.  SOCIAL HISTORY:   Social History   Tobacco Use  . Smoking status: Current Every Day Smoker    Packs/day: 0.50    Years: 30.00    Pack years: 15.00  . Smokeless tobacco: Never Used  Substance Use Topics  . Alcohol use: Yes    Comment: daily- beer, 3-4 /day      ALLERGIES:   Allergies  Allergen Reactions  . Codeine Hives  . Demerol [Meperidine] Hives     CURRENT MEDICATIONS:   Current Outpatient Medications  Medication Sig Dispense Refill  . acetaminophen (TYLENOL) 500 MG tablet Take 1,000 mg  by mouth every 6 (six) hours as needed (for pain.).     Marland Kitchen. azelastine (ASTELIN) 0.1 % nasal spray Place 2 sprays into both nostrils 2 (two) times daily as needed for allergies.   11  . cilostazol (PLETAL) 100 MG tablet Take 1 tablet (100 mg total) by mouth 2 (two) times daily before a meal. 60 tablet 11  . clopidogrel (PLAVIX) 75 MG tablet Take 1 tablet (75 mg total) daily by mouth. 30 tablet 11  . HYDROcodone-acetaminophen (NORCO) 5-325 MG tablet Take 1 tablet every 4 (four) hours as needed by mouth for moderate pain. 15 tablet 0  . olmesartan (BENICAR) 40 MG tablet Take 40 mg by mouth daily.  0  . ranitidine (ZANTAC) 300 MG tablet Take 300 mg by mouth daily.  3  . rosuvastatin (CRESTOR) 5 MG tablet Take 5 mg by mouth daily.  0  . tiZANidine (ZANAFLEX) 4 MG tablet Take 4 mg by mouth two times a day as needed for muscle spasms  0   No current facility-administered medications for this visit.     REVIEW OF SYSTEMS:   [X]  denotes positive finding, [ ]  denotes negative finding Cardiac  Comments:  Chest pain or chest pressure:    Shortness of breath upon exertion:    Short of breath when lying flat:    Irregular heart rhythm:  Vascular    Pain in calf, thigh, or hip brought on by ambulation:    Pain in feet at night that wakes you up from your sleep:     Blood clot in your veins:    Leg swelling:         Pulmonary    Oxygen at home:    Productive cough:     Wheezing:         Neurologic    Sudden weakness in arms or legs:     Sudden numbness in arms or legs:     Sudden onset of difficulty speaking or slurred speech:    Temporary loss of vision in one eye:     Problems with dizziness:         Gastrointestinal    Blood in stool:     Vomited blood:         Genitourinary    Burning when urinating:     Blood in urine:        Psychiatric    Major depression:         Hematologic    Bleeding problems:    Problems with blood clotting too easily:        Skin    Rashes or  ulcers:        Constitutional    Fever or chills:      PHYSICAL EXAM:   Vitals:   07/29/17 0916  BP: (!) 162/98  Pulse: 95  Resp: 16  Temp: (!) 97.5 F (36.4 C)  TempSrc: Oral  SpO2: 98%  Weight: 153 lb (69.4 kg)  Height: 5\' 11"  (1.803 m)    GENERAL: The patient is a well-nourished male, in no acute distress. The vital signs are documented above. CARDIAC: There is a regular rate and rhythm.  VASCULAR: 2+ femoral pulses bilaterally PULMONARY: Non-labored respirations ABDOMEN: Soft and non-tender with normal pitched bowel sounds.  MUSCULOSKELETAL: There are no major deformities or cyanosis. NEUROLOGIC: No focal weakness or paresthesias are detected. SKIN: There are no ulcers or rashes noted. PSYCHIATRIC: The patient has a normal affect.  STUDIES:   ABI on the right is 0.83 on the left is 0.87  MEDICAL ISSUES:   Claudication: The patient's symptoms have significantly improved with cilostazol and right iliac stenting.  The patient was instructed to contact me should he develop a recurrence of his symptoms, otherwise I will see him back in 6 months with repeat duplex and ABIs.  Smoking cessation: The patient was again encouraged to quit smoking.    Durene Cal, MD [facility] [phone] Pager [phone]

## 2017-07-31 NOTE — Addendum Note (Signed)
Addended by: Burton ApleyPETTY, Marcelino Campos A on: 07/31/2017 12:07 PM   Modules accepted: Orders

## 2017-08-22 DIAGNOSIS — H6593 Unspecified nonsuppurative otitis media, bilateral: Secondary | ICD-10-CM | POA: Diagnosis not present

## 2017-08-22 DIAGNOSIS — J069 Acute upper respiratory infection, unspecified: Secondary | ICD-10-CM | POA: Diagnosis not present

## 2017-08-22 DIAGNOSIS — F17290 Nicotine dependence, other tobacco product, uncomplicated: Secondary | ICD-10-CM | POA: Diagnosis not present

## 2017-08-22 DIAGNOSIS — J040 Acute laryngitis: Secondary | ICD-10-CM | POA: Diagnosis not present

## 2017-09-19 ENCOUNTER — Other Ambulatory Visit: Payer: Self-pay | Admitting: Family Medicine

## 2017-09-19 DIAGNOSIS — R1310 Dysphagia, unspecified: Secondary | ICD-10-CM | POA: Diagnosis not present

## 2017-09-19 DIAGNOSIS — J984 Other disorders of lung: Secondary | ICD-10-CM | POA: Diagnosis not present

## 2017-09-19 DIAGNOSIS — F17290 Nicotine dependence, other tobacco product, uncomplicated: Secondary | ICD-10-CM | POA: Diagnosis not present

## 2017-09-19 DIAGNOSIS — Z0000 Encounter for general adult medical examination without abnormal findings: Secondary | ICD-10-CM | POA: Diagnosis not present

## 2017-09-19 DIAGNOSIS — Z139 Encounter for screening, unspecified: Secondary | ICD-10-CM | POA: Diagnosis not present

## 2017-09-19 DIAGNOSIS — Z114 Encounter for screening for human immunodeficiency virus [HIV]: Secondary | ICD-10-CM | POA: Diagnosis not present

## 2017-09-19 DIAGNOSIS — Z125 Encounter for screening for malignant neoplasm of prostate: Secondary | ICD-10-CM | POA: Diagnosis not present

## 2017-09-19 DIAGNOSIS — K219 Gastro-esophageal reflux disease without esophagitis: Secondary | ICD-10-CM | POA: Diagnosis not present

## 2017-09-23 ENCOUNTER — Ambulatory Visit
Admission: RE | Admit: 2017-09-23 | Discharge: 2017-09-23 | Disposition: A | Discharge status: Home / Self Care | Payer: BLUE CROSS/BLUE SHIELD | Source: Ambulatory Visit | Attending: Family Medicine | Admitting: Family Medicine

## 2017-09-23 ENCOUNTER — Other Ambulatory Visit: Payer: Self-pay | Admitting: Family Medicine

## 2017-09-23 DIAGNOSIS — K449 Diaphragmatic hernia without obstruction or gangrene: Secondary | ICD-10-CM | POA: Diagnosis not present

## 2017-09-23 DIAGNOSIS — R1310 Dysphagia, unspecified: Secondary | ICD-10-CM

## 2017-09-23 DIAGNOSIS — R05 Cough: Secondary | ICD-10-CM | POA: Diagnosis not present

## 2017-09-24 DIAGNOSIS — K210 Gastro-esophageal reflux disease with esophagitis: Secondary | ICD-10-CM | POA: Diagnosis not present

## 2017-09-24 DIAGNOSIS — R1310 Dysphagia, unspecified: Secondary | ICD-10-CM | POA: Diagnosis not present

## 2017-09-24 DIAGNOSIS — Z6821 Body mass index (BMI) 21.0-21.9, adult: Secondary | ICD-10-CM | POA: Diagnosis not present

## 2017-09-24 DIAGNOSIS — K449 Diaphragmatic hernia without obstruction or gangrene: Secondary | ICD-10-CM | POA: Diagnosis not present

## 2017-09-24 DIAGNOSIS — Z0000 Encounter for general adult medical examination without abnormal findings: Secondary | ICD-10-CM | POA: Diagnosis not present

## 2017-09-24 DIAGNOSIS — K219 Gastro-esophageal reflux disease without esophagitis: Secondary | ICD-10-CM | POA: Diagnosis not present

## 2017-10-24 ENCOUNTER — Other Ambulatory Visit: Payer: Self-pay | Admitting: Surgery

## 2017-10-24 MED ORDER — CLOPIDOGREL BISULFATE 75 MG PO TABS: 75.0000 mg | ORAL_TABLET | Freq: Every day | ORAL | 3 refills | Status: DC

## 2018-01-20 DIAGNOSIS — E291 Testicular hypofunction: Secondary | ICD-10-CM | POA: Diagnosis not present

## 2018-01-20 DIAGNOSIS — I10 Essential (primary) hypertension: Secondary | ICD-10-CM | POA: Diagnosis not present

## 2018-01-20 DIAGNOSIS — N400 Enlarged prostate without lower urinary tract symptoms: Secondary | ICD-10-CM | POA: Diagnosis not present

## 2018-01-20 DIAGNOSIS — E7800 Pure hypercholesterolemia, unspecified: Secondary | ICD-10-CM | POA: Diagnosis not present

## 2018-01-20 DIAGNOSIS — Z79899 Other long term (current) drug therapy: Secondary | ICD-10-CM | POA: Diagnosis not present

## 2018-01-20 DIAGNOSIS — F17210 Nicotine dependence, cigarettes, uncomplicated: Secondary | ICD-10-CM | POA: Diagnosis not present

## 2018-01-20 DIAGNOSIS — R700 Elevated erythrocyte sedimentation rate: Secondary | ICD-10-CM | POA: Diagnosis not present

## 2018-01-28 ENCOUNTER — Ambulatory Visit (HOSPITAL_COMMUNITY): Payer: BLUE CROSS/BLUE SHIELD | Attending: Family

## 2018-01-28 ENCOUNTER — Ambulatory Visit: Payer: BLUE CROSS/BLUE SHIELD | Admitting: Family

## 2018-01-28 ENCOUNTER — Ambulatory Visit (HOSPITAL_COMMUNITY): Payer: BLUE CROSS/BLUE SHIELD

## 2018-01-31 ENCOUNTER — Encounter (HOSPITAL_COMMUNITY): Payer: Self-pay

## 2018-01-31 ENCOUNTER — Ambulatory Visit: Payer: Self-pay | Admitting: Family

## 2018-02-12 ENCOUNTER — Other Ambulatory Visit: Payer: Self-pay

## 2018-02-12 DIAGNOSIS — I70213 Atherosclerosis of native arteries of extremities with intermittent claudication, bilateral legs: Secondary | ICD-10-CM

## 2018-02-12 DIAGNOSIS — R6889 Other general symptoms and signs: Secondary | ICD-10-CM

## 2018-02-12 DIAGNOSIS — I739 Peripheral vascular disease, unspecified: Secondary | ICD-10-CM

## 2018-02-15 ENCOUNTER — Other Ambulatory Visit: Payer: Self-pay | Admitting: Surgery

## 2018-02-22 ENCOUNTER — Other Ambulatory Visit: Payer: Self-pay | Admitting: Surgery

## 2018-02-28 DIAGNOSIS — I10 Essential (primary) hypertension: Secondary | ICD-10-CM | POA: Diagnosis not present

## 2018-02-28 DIAGNOSIS — E291 Testicular hypofunction: Secondary | ICD-10-CM | POA: Diagnosis not present

## 2018-02-28 DIAGNOSIS — N521 Erectile dysfunction due to diseases classified elsewhere: Secondary | ICD-10-CM | POA: Diagnosis not present

## 2018-02-28 DIAGNOSIS — F17210 Nicotine dependence, cigarettes, uncomplicated: Secondary | ICD-10-CM | POA: Diagnosis not present

## 2018-03-03 ENCOUNTER — Ambulatory Visit (INDEPENDENT_AMBULATORY_CARE_PROVIDER_SITE_OTHER)
Admission: RE | Admit: 2018-03-03 | Discharge: 2018-03-03 | Disposition: A | Discharge status: Home / Self Care | Payer: BLUE CROSS/BLUE SHIELD | Source: Ambulatory Visit | Attending: Surgery | Admitting: Surgery

## 2018-03-03 ENCOUNTER — Other Ambulatory Visit: Payer: Self-pay

## 2018-03-03 ENCOUNTER — Encounter: Payer: Self-pay | Admitting: Family

## 2018-03-03 ENCOUNTER — Ambulatory Visit (HOSPITAL_COMMUNITY)
Admission: RE | Admit: 2018-03-03 | Discharge: 2018-03-03 | Disposition: A | Discharge status: Home / Self Care | Payer: BLUE CROSS/BLUE SHIELD | Source: Ambulatory Visit | Attending: Surgery | Admitting: Surgery

## 2018-03-03 ENCOUNTER — Ambulatory Visit (INDEPENDENT_AMBULATORY_CARE_PROVIDER_SITE_OTHER): Payer: BLUE CROSS/BLUE SHIELD | Admitting: Family

## 2018-03-03 VITALS — BP 145/88 | HR 76 | Temp 97.4°F | Resp 16 | Ht 71.0 in | Wt 158.0 lb

## 2018-03-03 DIAGNOSIS — I739 Peripheral vascular disease, unspecified: Secondary | ICD-10-CM | POA: Insufficient documentation

## 2018-03-03 DIAGNOSIS — F17200 Nicotine dependence, unspecified, uncomplicated: Secondary | ICD-10-CM | POA: Diagnosis not present

## 2018-03-03 DIAGNOSIS — I70213 Atherosclerosis of native arteries of extremities with intermittent claudication, bilateral legs: Secondary | ICD-10-CM | POA: Insufficient documentation

## 2018-03-03 DIAGNOSIS — E785 Hyperlipidemia, unspecified: Secondary | ICD-10-CM | POA: Diagnosis not present

## 2018-03-03 DIAGNOSIS — Z95828 Presence of other vascular implants and grafts: Secondary | ICD-10-CM | POA: Insufficient documentation

## 2018-03-03 DIAGNOSIS — R6889 Other general symptoms and signs: Secondary | ICD-10-CM | POA: Diagnosis not present

## 2018-03-03 DIAGNOSIS — I10 Essential (primary) hypertension: Secondary | ICD-10-CM | POA: Diagnosis not present

## 2018-03-03 DIAGNOSIS — I779 Disorder of arteries and arterioles, unspecified: Secondary | ICD-10-CM | POA: Diagnosis not present

## 2018-03-03 NOTE — Progress Notes (Signed)
[facility] [place]   CC: Follow up peripheral artery occlusive disease  History of Present Illness [name] Ross is a 57 y.o. male who is s/p arteriogram with placement of stent in right common iliac artery and placement of stent in right external iliac artery on 05-28-17 by Dr. Myra Gianotti for right hip claudication.  The patient suffers from bilateral claudication. He was started on cilostazol with significant improvement in his calf symptoms, however his hip pain persisted. Findings:  Aortogram:No significant renal artery stenosis. The infrarenal abdominal aorta is patent throughout it's course. The left common and external iliac artery are widely patent. The left hypogastric artery is occluded. The right common iliac artery is patent throughout it's course. There is a high-grade stenosis at the origin of the right internal iliac artery and there is near occlusion of the proximal right external iliac artery. Right Lower Extremity: The right common femoral and profunda femoral artery are patent throughout their course. The superficial femoral artery is occluded with reconstitution of the above-knee popliteal artery and three-vessel runoff Left Lower Extremity:The left common femoral and profunda femoral artery are patent throughout their course. The superficial femoral artery is occluded with reconstitution of the above-knee popliteal artery and three-vessel runoff   He returned on 05-31-17 after received a call from patient's wife states" I want someone to check his leg". Went to ER the previous night due to acute pain and swelling and light oozing of blood at right groin. Swelling that day was down some. He complained of pain and tenderness in his left groin and pubis that started on 05-28-17 after the arteriogram.  He denied fever or chills.  There was no open wound at his left groin or at his lower extremities.   Pt states  the pain in his right hip has resolved since the right iliac artery stenting, and the pain he is experiencing in both hips since about February 2019 is relieved by taking tylenol, states it does not feel like the same pain as before his iliac artery stenting, feels like arthritis.   Dr. Myra Gianotti last evaluated pt on 07-29-17. At that time the patient's symptoms had significantly improved with cilostazol and right iliac stenting.  The patient was instructed to contact us should he develop a recurrence of his symptoms, otherwise will see him back in 6 months with repeat duplex and ABIs. Smoking cessation: The patient was again encouraged to quit smoking.  He is still taking Pletal 100 mg bid.   He walks a great deal on his job, over a mile.   A forklift ran over his feet about 2016; he sees a podiatrist since this. He is able to walk with no problems.     Diabetic: No Tobacco use: smoker  (less than 1 ppd, started about age 35 yrs), is taking Chantix now  Pt meds include: Statin :Yes Betablocker: No ASA: No Other anticoagulants/antiplatelets: Plavix   Past Medical History:  Diagnosis Date  . Back pain   . Chronic bronchitis (HCC)   . COPD (chronic obstructive pulmonary disease) (HCC)   . GERD (gastroesophageal reflux disease)   . Multiple lung nodules   . Neuromuscular disorder (HCC)    leg- pain causing a reduced stamina for walking , due to leg pain.   . Tobacco abuse     Social History Social History   Tobacco Use  . Smoking status: Current Every Day Smoker    Packs/day: 1.00    Years: 30.00  Pack years: 30.00  . Smokeless tobacco: Never Used  . Tobacco comment: Currently using Chantix  Substance Use Topics  . Alcohol use: Yes    Comment: daily- beer, 3-4 /day   . Drug use: No    Family History No family history on file.  Past Surgical History:  Procedure Laterality Date  . ABDOMINAL AORTOGRAM N/A 05/28/2017   Procedure: ABDOMINAL AORTOGRAM;  Surgeon: Nada Libman, MD;  Location: [facility] INVASIVE CV LAB;  Service: Cardiovascular;  Laterality: N/A;  . APPENDECTOMY  1980  . BACK SURGERY  1980's  . LOWER EXTREMITY ANGIOGRAPHY Bilateral 05/28/2017   Procedure: Lower Extremity Angiography;  Surgeon: Nada Libman, MD;  Location: [facility] INVASIVE CV LAB;  Service: Cardiovascular;  Laterality: Bilateral;  . PERIPHERAL VASCULAR INTERVENTION Right 05/28/2017   Procedure: PERIPHERAL VASCULAR INTERVENTION;  Surgeon: Nada Libman, MD;  Location: [facility] INVASIVE CV LAB;  Service: Cardiovascular;  Laterality: Right;  ext iliac  . VIDEO ASSISTED THORACOSCOPY (VATS)/WEDGE RESECTION Right 10/29/2016   Procedure: VIDEO ASSISTED THORACOSCOPY (VATS)/Right upper lobe wedge RESECTION;  Surgeon: Delight Ovens, MD;  Location: [facility] OR;  Service: Thoracic;  Laterality: Right;  Marland Kitchen VIDEO BRONCHOSCOPY N/A 10/29/2016   Procedure: VIDEO BRONCHOSCOPY;  Surgeon: Delight Ovens, MD;  Location: [facility] OR;  Service: Thoracic;  Laterality: N/A;  . VIDEO BRONCHOSCOPY WITH ENDOBRONCHIAL NAVIGATION N/A 04/02/2016   Procedure: VIDEO BRONCHOSCOPY WITH ENDOBRONCHIAL NAVIGATION;  Surgeon: Delight Ovens, MD;  Location: [facility] OR;  Service: Thoracic;  Laterality: N/A;    Allergies  Allergen Reactions  . Codeine Hives  . Demerol [Meperidine] Hives    Current Outpatient Medications  Medication Sig Dispense Refill  . acetaminophen (TYLENOL) 500 MG tablet Take 1,000 mg by mouth every 6 (six) hours as needed (for pain.).     Marland Kitchen azelastine (ASTELIN) 0.1 % nasal spray Place 2 sprays into both nostrils 2 (two) times daily as needed for allergies.   11  . cilostazol (PLETAL) 100 MG tablet TAKE 1 TABLET BY MOUTH TWICE A DAY BEFORE A MEAL 180 tablet 3  . clopidogrel (PLAVIX) 75 MG tablet Take 1 tablet (75 mg total) by mouth daily. 30 tablet 3  . HYDROcodone-acetaminophen (NORCO) 5-325 MG tablet Take 1 tablet every 4 (four) hours as needed by mouth for moderate pain. 15 tablet 0  . olmesartan (BENICAR) 40 MG  tablet Take 40 mg by mouth daily.  0  . ranitidine (ZANTAC) 300 MG tablet Take 300 mg by mouth daily.  3  . rosuvastatin (CRESTOR) 5 MG tablet Take 5 mg by mouth daily.  0  . tiZANidine (ZANAFLEX) 4 MG tablet Take 4 mg by mouth two times a day as needed for muscle spasms  0   No current facility-administered medications for this visit.     ROS: See HPI for pertinent positives and negatives.   Physical Examination  Vitals:   03/03/18 0933 03/03/18 0935  BP: (!) 144/88 (!) 145/88  Pulse: 76   Resp: 16   Temp: (!) 97.4 F (36.3 C)   TempSrc: Oral   SpO2: 96%   Weight: 158 lb (71.7 kg)   Height: 5\' 11"  (1.803 m)    Body mass index is 22.04 kg/m.  General: A&O x 3, WDWN, male. Gait: normal HENT: No gross abnormalities.  Eyes: PERRLA. Pulmonary: Respirations are non labored, CTAB, good air movement in all fields Cardiac: regular rhythm, no detected murmur.         Carotid Bruits Right Left  Negative Negative   Radial pulses are 2+ palpable bilaterally   Adominal aortic pulse is not palpable                         VASCULAR EXAM: Extremities without ischemic changes, without Gangrene; without open wounds. Thick toenails of several toes, both feet.                                                                                                          LE Pulses Right Left       FEMORAL  2+ palpable  2+ palpable        POPLITEAL  not palpable   not palpable       POSTERIOR TIBIAL  not palpable   not palpable        DORSALIS PEDIS      ANTERIOR TIBIAL not palpable  not palpable    Abdomen: soft, NT, no palpable masses. Skin: no rashes, no cellulitis, no ulcers noted. Musculoskeletal: no muscle wasting or atrophy.  Neurologic: A&O X 3; appropriate affect, Sensation is normal; MOTOR FUNCTION:  moving all extremities equally, motor strength 5/5 throughout. Speech is fluent/normal. CN 2-12 intact. Psychiatric: Thought content is normal, mood appropriate for clinical  situation.     ASSESSMENT: [name] Ross is a 57 y.o. male who is s/p arteriogram with placement of stent in right common iliac artery and placement of stent in right external iliac artery on 05-28-17 by Dr. Myra GianottiBrabham for right hip claudication.   His right hip claudication has resolved, he has a different type of pain in both hips that seem like arthritis, relieved by Tylenol.   Fortunately he does not have DM, but unfortunately he continues to smoke since age 57, is taking Chantix now, cutting back, trying to quit.   He continues to take Pletal, also takes Plavix and a statin.  He walks over a mile daily on his job.   DATA  Right Aortoiliac Duplex (03-03-18): No in stent stenosis, inflow with velocity of 220 cm/s with plaque visualized, all biphasic waveforms..   ABI (Date: 03/03/2018):  R:   ABI: 0.89 (was 0.83 on 07-29-17),   PT: mono  DP: mono  TBI:  0.47, toe pressure 63 (was 0.79)  L:   ABI: 0.87 (was 0.87),   PT: mono  DP: bi  TBI: 0.65, toe pressure 86 (was 0.69) Stable bilateral ABI with mild disease, monophasic waveforms in the right, mono and biphasic in the left. Decline in right TBI, stable left TBI.    PLAN:  Based on the patient's vascular studies and examination, pt will return to clinic in 3 months with right aortoiliac duplex and ABI's.  I advised him to notify us if he develops concerns re the circulation in his feet or legs.  Continue extensive walking.   Over 3 minutes was spent counseling patient re smoking cessation, and patient was given several free resources re smoking cessation.   I discussed in depth with the patient the nature of atherosclerosis, and  emphasized the importance of maximal medical management including strict control of blood pressure, blood glucose, and lipid levels, obtaining regular exercise, and cessation of smoking.  The patient is aware that without maximal medical management the underlying atherosclerotic disease process  will progress, limiting the benefit of any interventions.  The patient was given information about PAD including signs, symptoms, treatment, what symptoms should prompt the patient to seek immediate medical care, and risk reduction measures to take.  Charisse MarchSuzanne Jami Bogdanski, RN, MSN, FNP-C [facility] [place] Office Phone: [phone]  Clinic MD: Myra GianottiBrabham  03/03/18 10:19 AM

## 2018-03-03 NOTE — Patient Instructions (Signed)
Steps to Quit Smoking Smoking tobacco can be bad for your health. It can also affect almost every organ in your body. Smoking puts you and people around you at risk for many serious long-lasting (chronic) diseases. Quitting smoking is hard, but it is one of the best things that you can do for your health. It is never too late to quit. What are the benefits of quitting smoking? When you quit smoking, you lower your risk for getting serious diseases and conditions. They can include:  Lung cancer or lung disease.  Heart disease.  Stroke.  Heart attack.  Not being able to have children (infertility).  Weak bones (osteoporosis) and broken bones (fractures).  If you have coughing, wheezing, and shortness of breath, those symptoms may get better when you quit. You may also get sick less often. If you are pregnant, quitting smoking can help to lower your chances of having a baby of low birth weight. What can I do to help me quit smoking? Talk with your doctor about what can help you quit smoking. Some things you can do (strategies) include:  Quitting smoking totally, instead of slowly cutting back how much you smoke over a period of time.  Going to in-person counseling. You are more likely to quit if you go to many counseling sessions.  Using resources and support systems, such as: ? Online chats with a counselor. ? Phone quitlines. ? Printed self-help materials. ? Support groups or group counseling. ? Text messaging programs. ? Mobile phone apps or applications.  Taking medicines. Some of these medicines may have nicotine in them. If you are pregnant or breastfeeding, do not take any medicines to quit smoking unless your doctor says it is okay. Talk with your doctor about counseling or other things that can help you.  Talk with your doctor about using more than one strategy at the same time, such as taking medicines while you are also going to in-person counseling. This can help make  quitting easier. What things can I do to make it easier to quit? Quitting smoking might feel very hard at first, but there is a lot that you can do to make it easier. Take these steps:  Talk to your family and friends. Ask them to support and encourage you.  Call phone quitlines, reach out to support groups, or work with a counselor.  Ask people who smoke to not smoke around you.  Avoid places that make you want (trigger) to smoke, such as: ? Bars. ? Parties. ? Smoke-break areas at work.  Spend time with people who do not smoke.  Lower the stress in your life. Stress can make you want to smoke. Try these things to help your stress: ? Getting regular exercise. ? Deep-breathing exercises. ? Yoga. ? Meditating. ? Doing a body scan. To do this, close your eyes, focus on one area of your body at a time from head to toe, and notice which parts of your body are tense. Try to relax the muscles in those areas.  Download or buy apps on your mobile phone or tablet that can help you stick to your quit plan. There are many free apps, such as QuitGuide from the CDC (Centers for Disease Control and Prevention). You can find more support from smokefree.gov and other websites.  This information is not intended to replace advice given to you by your health care provider. Make sure you discuss any questions you have with your health care provider. Document Released: 05/05/2009 Document   Revised: 03/06/2016 Document Reviewed: 11/23/2014 Elsevier Interactive Patient Education  2018 Elsevier Inc.     Peripheral Vascular Disease Peripheral vascular disease (PVD) is a disease of the blood vessels that are not part of your heart and brain. A simple term for PVD is poor circulation. In most cases, PVD narrows the blood vessels that carry blood from your heart to the rest of your body. This can result in a decreased supply of blood to your arms, legs, and internal organs, like your stomach or kidneys.  However, it most often affects a person's lower legs and feet. There are two types of PVD.  Organic PVD. This is the more common type. It is caused by damage to the structure of blood vessels.  Functional PVD. This is caused by conditions that make blood vessels contract and tighten (spasm).  Without treatment, PVD tends to get worse over time. PVD can also lead to acute ischemic limb. This is when an arm or limb suddenly has trouble getting enough blood. This is a medical emergency. Follow these instructions at home:  Take medicines only as told by your doctor.  Do not use any tobacco products, including cigarettes, chewing tobacco, or electronic cigarettes. If you need help quitting, ask your doctor.  Lose weight if you are overweight, and maintain a healthy weight as told by your doctor.  Eat a diet that is low in fat and cholesterol. If you need help, ask your doctor.  Exercise regularly. Ask your doctor for some good activities for you.  Take good care of your feet. ? Wear comfortable shoes that fit well. ? Check your feet often for any cuts or sores. Contact a doctor if:  You have cramps in your legs while walking.  You have leg pain when you are at rest.  You have coldness in a leg or foot.  Your skin changes.  You are unable to get or have an erection (erectile dysfunction).  You have cuts or sores on your feet that are not healing. Get help right away if:  Your arm or leg turns cold and blue.  Your arms or legs become red, warm, swollen, painful, or numb.  You have chest pain or trouble breathing.  You suddenly have weakness in your face, arm, or leg.  You become very confused or you cannot speak.  You suddenly have a very bad headache.  You suddenly cannot see. This information is not intended to replace advice given to you by your health care provider. Make sure you discuss any questions you have with your health care provider. Document Released:  10/03/2009 Document Revised: 12/15/2015 Document Reviewed: 12/17/2013 Elsevier Interactive Patient Education  2017 Elsevier Inc.  

## 2018-03-10 ENCOUNTER — Other Ambulatory Visit: Payer: Self-pay

## 2018-03-10 DIAGNOSIS — I779 Disorder of arteries and arterioles, unspecified: Secondary | ICD-10-CM

## 2018-03-10 DIAGNOSIS — I70213 Atherosclerosis of native arteries of extremities with intermittent claudication, bilateral legs: Secondary | ICD-10-CM

## 2018-03-10 DIAGNOSIS — Z95828 Presence of other vascular implants and grafts: Secondary | ICD-10-CM

## 2018-04-14 IMAGING — DX DG CHEST 2V
2 series · 2 of 2 positions shown · non-contrast
Comparison: November 26, 2016

CLINICAL DATA: Status post VATS procedure with partial lobectomy
right lung.

EXAM:
CHEST  2 VIEW

[dg chest 2 view (1 of 2)]
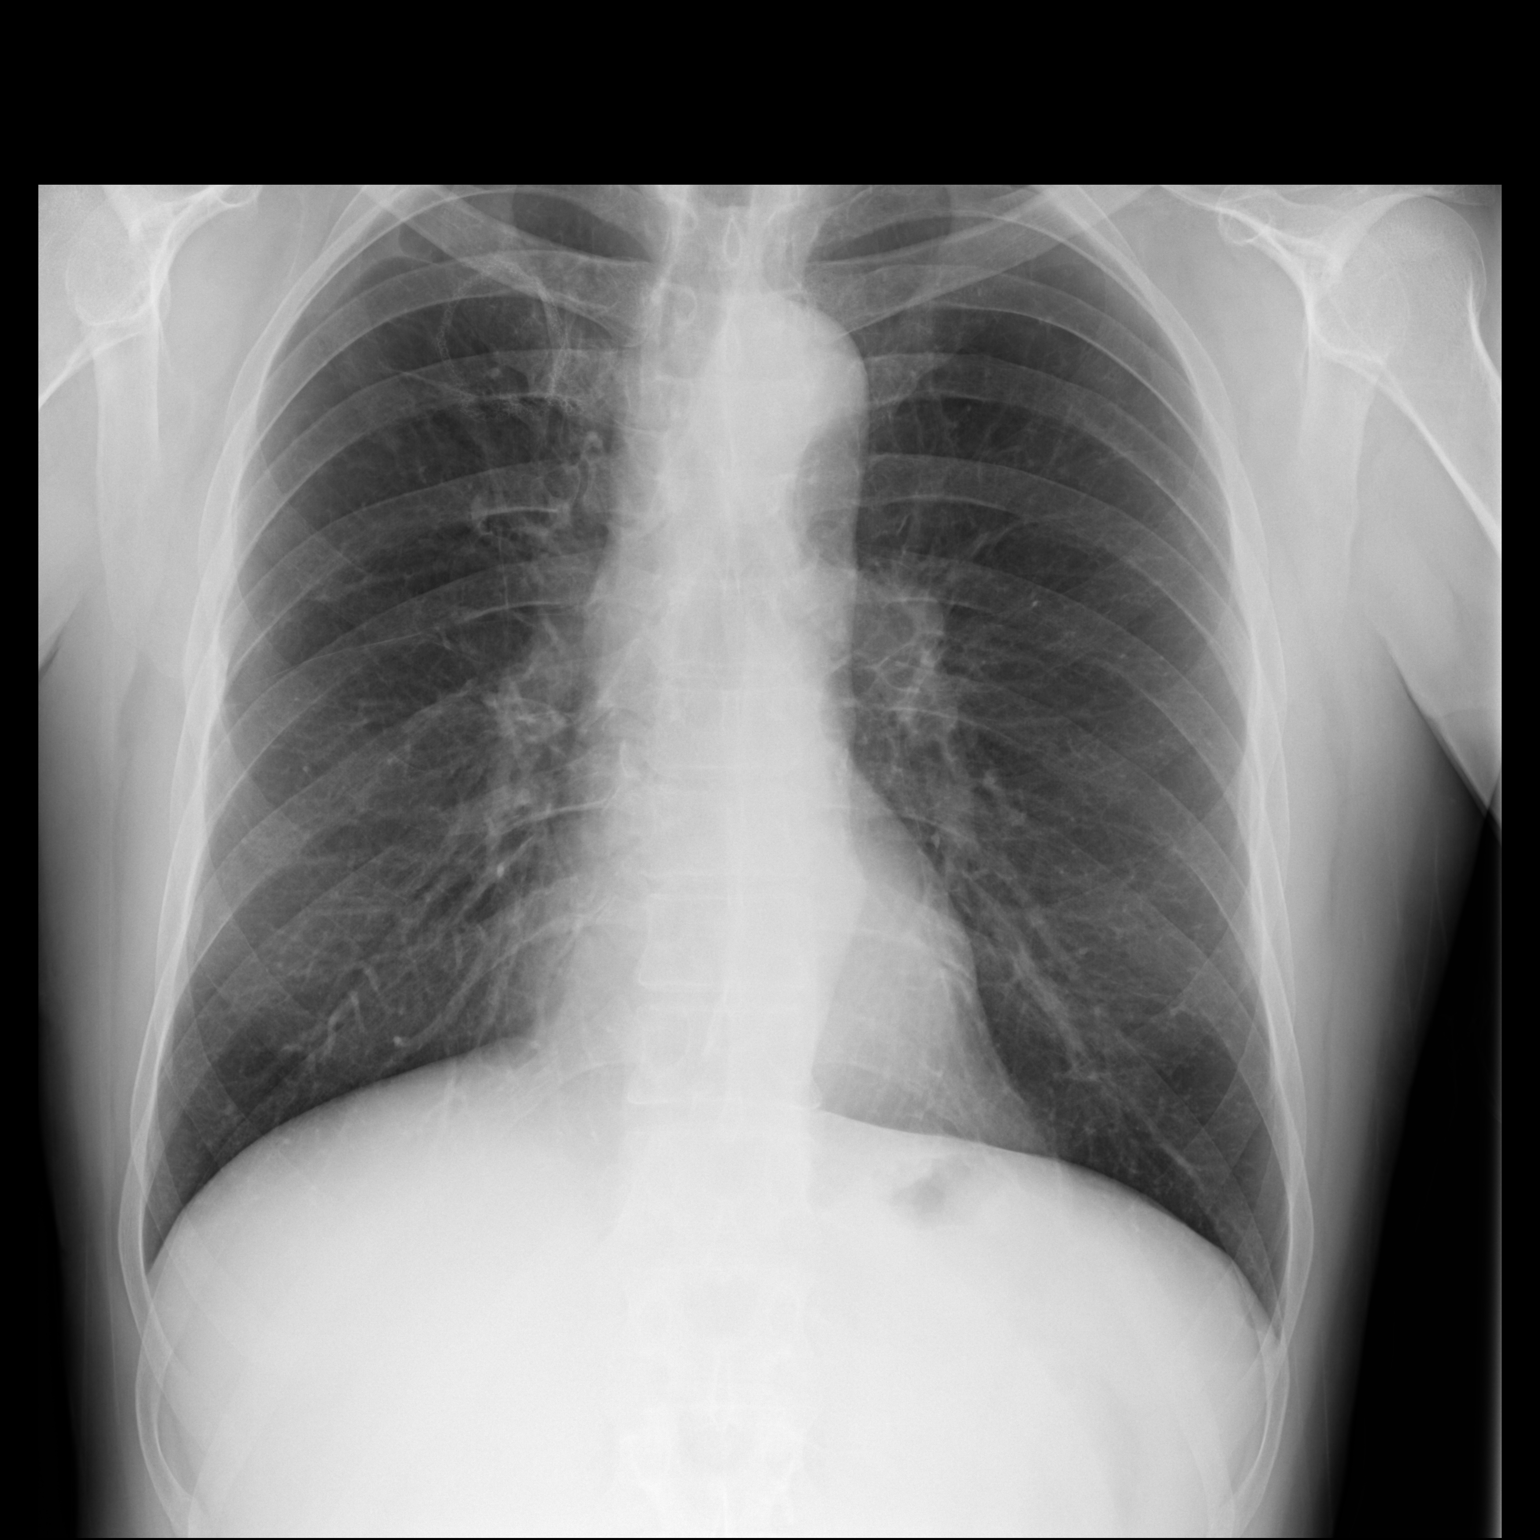

[dg chest 2 view (2 of 2)]
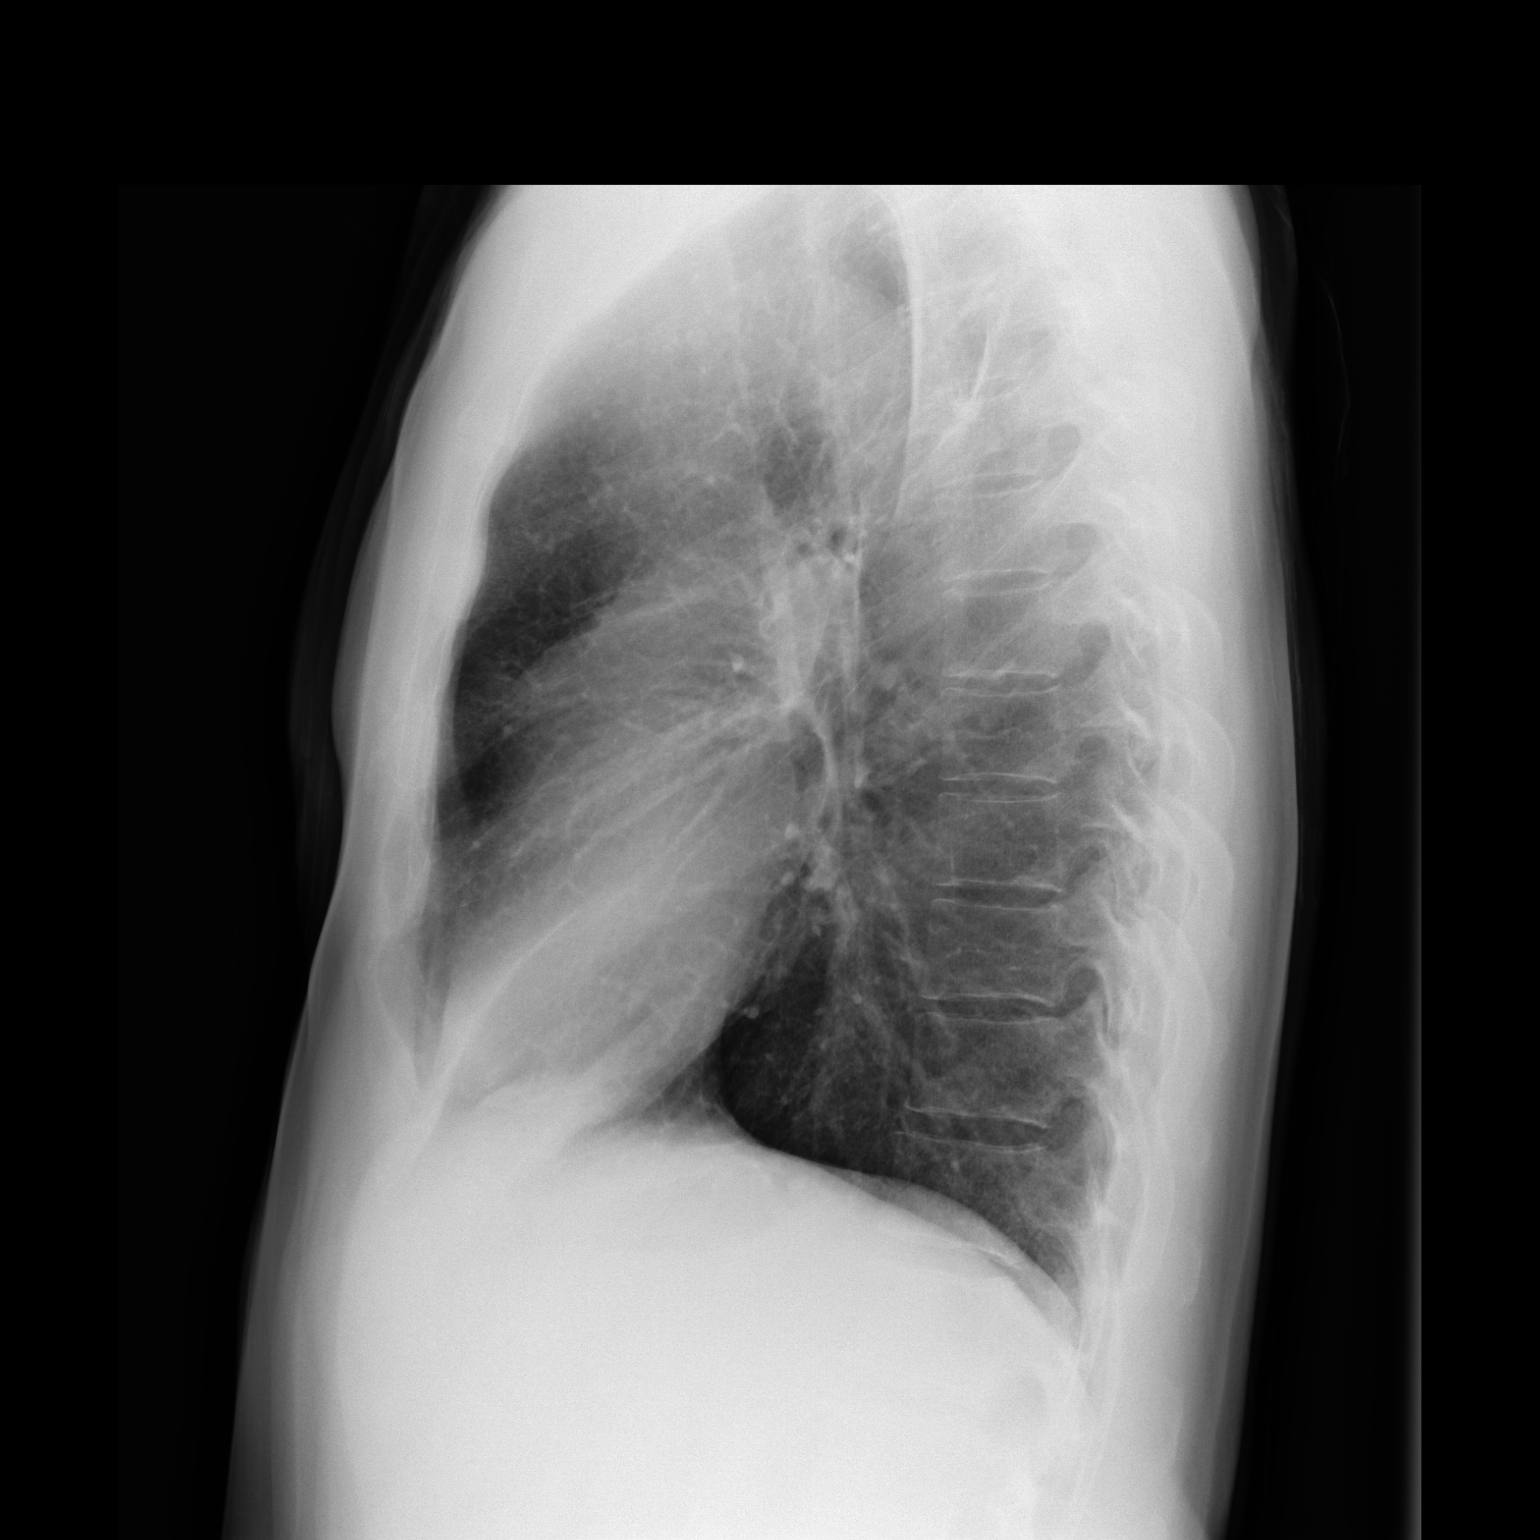

[2 of 2 positions shown; findings below may reference images not displayed]

FINDINGS: There is stable postoperative change on the right. There is no edema
or consolidation. No parenchymal nodular opacity evident. Heart size
and pulmonary vascularity are normal. No adenopathy. No evident bone
lesions.
IMPRESSION: Postoperative change on the right. No edema or consolidation. No
mass or adenopathy evident.

## 2018-05-23 DIAGNOSIS — M7542 Impingement syndrome of left shoulder: Secondary | ICD-10-CM | POA: Diagnosis not present

## 2018-05-23 DIAGNOSIS — E299 Testicular dysfunction, unspecified: Secondary | ICD-10-CM | POA: Diagnosis not present

## 2018-05-23 DIAGNOSIS — M25511 Pain in right shoulder: Secondary | ICD-10-CM | POA: Diagnosis not present

## 2018-05-23 DIAGNOSIS — E291 Testicular hypofunction: Secondary | ICD-10-CM | POA: Diagnosis not present

## 2018-06-03 ENCOUNTER — Encounter (HOSPITAL_COMMUNITY): Payer: BLUE CROSS/BLUE SHIELD

## 2018-06-03 ENCOUNTER — Ambulatory Visit: Payer: BLUE CROSS/BLUE SHIELD | Admitting: Family

## 2018-06-03 ENCOUNTER — Inpatient Hospital Stay (HOSPITAL_COMMUNITY): Admission: RE | Admit: 2018-06-03 | Payer: BLUE CROSS/BLUE SHIELD | Source: Ambulatory Visit

## 2018-06-04 ENCOUNTER — Encounter: Payer: Self-pay | Admitting: Family

## 2018-09-08 DIAGNOSIS — J40 Bronchitis, not specified as acute or chronic: Secondary | ICD-10-CM | POA: Diagnosis not present

## 2018-09-16 DIAGNOSIS — M25512 Pain in left shoulder: Secondary | ICD-10-CM | POA: Diagnosis not present

## 2018-09-16 DIAGNOSIS — H6501 Acute serous otitis media, right ear: Secondary | ICD-10-CM | POA: Diagnosis not present

## 2018-09-16 DIAGNOSIS — G8929 Other chronic pain: Secondary | ICD-10-CM | POA: Diagnosis not present

## 2018-10-09 DIAGNOSIS — R079 Chest pain, unspecified: Secondary | ICD-10-CM | POA: Diagnosis not present

## 2018-10-09 DIAGNOSIS — K219 Gastro-esophageal reflux disease without esophagitis: Secondary | ICD-10-CM | POA: Diagnosis not present

## 2018-10-09 DIAGNOSIS — H6504 Acute serous otitis media, recurrent, right ear: Secondary | ICD-10-CM | POA: Diagnosis not present

## 2018-10-09 DIAGNOSIS — R000 Tachycardia, unspecified: Secondary | ICD-10-CM | POA: Diagnosis not present

## 2018-10-13 DIAGNOSIS — J42 Unspecified chronic bronchitis: Secondary | ICD-10-CM | POA: Diagnosis not present

## 2018-10-13 DIAGNOSIS — R079 Chest pain, unspecified: Secondary | ICD-10-CM | POA: Diagnosis not present

## 2018-10-13 DIAGNOSIS — R000 Tachycardia, unspecified: Secondary | ICD-10-CM | POA: Diagnosis not present

## 2018-10-13 DIAGNOSIS — J439 Emphysema, unspecified: Secondary | ICD-10-CM | POA: Diagnosis not present

## 2018-10-20 DIAGNOSIS — M549 Dorsalgia, unspecified: Secondary | ICD-10-CM | POA: Diagnosis not present

## 2018-10-30 DIAGNOSIS — I77810 Thoracic aortic ectasia: Secondary | ICD-10-CM | POA: Diagnosis not present

## 2018-10-30 DIAGNOSIS — R000 Tachycardia, unspecified: Secondary | ICD-10-CM | POA: Diagnosis not present

## 2018-11-17 DIAGNOSIS — I471 Supraventricular tachycardia: Secondary | ICD-10-CM | POA: Diagnosis not present

## 2018-11-18 DIAGNOSIS — I471 Supraventricular tachycardia: Secondary | ICD-10-CM | POA: Diagnosis not present

## 2018-11-18 DIAGNOSIS — R079 Chest pain, unspecified: Secondary | ICD-10-CM | POA: Diagnosis not present

## 2018-11-18 DIAGNOSIS — I77810 Thoracic aortic ectasia: Secondary | ICD-10-CM | POA: Diagnosis not present

## 2018-11-18 DIAGNOSIS — R000 Tachycardia, unspecified: Secondary | ICD-10-CM | POA: Diagnosis not present

## 2018-12-08 DIAGNOSIS — R079 Chest pain, unspecified: Secondary | ICD-10-CM | POA: Diagnosis not present

## 2018-12-12 DIAGNOSIS — J439 Emphysema, unspecified: Secondary | ICD-10-CM | POA: Diagnosis not present

## 2018-12-12 DIAGNOSIS — I471 Supraventricular tachycardia: Secondary | ICD-10-CM | POA: Diagnosis not present

## 2018-12-12 DIAGNOSIS — I10 Essential (primary) hypertension: Secondary | ICD-10-CM | POA: Diagnosis not present

## 2018-12-12 DIAGNOSIS — R9439 Abnormal result of other cardiovascular function study: Secondary | ICD-10-CM | POA: Diagnosis not present

## 2018-12-12 DIAGNOSIS — I77810 Thoracic aortic ectasia: Secondary | ICD-10-CM | POA: Diagnosis not present

## 2018-12-12 DIAGNOSIS — R079 Chest pain, unspecified: Secondary | ICD-10-CM | POA: Diagnosis not present

## 2018-12-18 DIAGNOSIS — R079 Chest pain, unspecified: Secondary | ICD-10-CM | POA: Diagnosis not present

## 2018-12-18 DIAGNOSIS — R9439 Abnormal result of other cardiovascular function study: Secondary | ICD-10-CM | POA: Diagnosis not present

## 2018-12-22 DIAGNOSIS — R0789 Other chest pain: Secondary | ICD-10-CM | POA: Diagnosis not present

## 2018-12-22 DIAGNOSIS — I77810 Thoracic aortic ectasia: Secondary | ICD-10-CM | POA: Diagnosis not present

## 2018-12-22 DIAGNOSIS — R9439 Abnormal result of other cardiovascular function study: Secondary | ICD-10-CM | POA: Diagnosis not present

## 2018-12-22 DIAGNOSIS — Z1159 Encounter for screening for other viral diseases: Secondary | ICD-10-CM | POA: Diagnosis not present

## 2018-12-22 DIAGNOSIS — Z01812 Encounter for preprocedural laboratory examination: Secondary | ICD-10-CM | POA: Diagnosis not present

## 2018-12-22 DIAGNOSIS — I471 Supraventricular tachycardia: Secondary | ICD-10-CM | POA: Diagnosis not present

## 2018-12-25 DIAGNOSIS — I10 Essential (primary) hypertension: Secondary | ICD-10-CM | POA: Diagnosis not present

## 2018-12-25 DIAGNOSIS — Z885 Allergy status to narcotic agent status: Secondary | ICD-10-CM | POA: Diagnosis not present

## 2018-12-25 DIAGNOSIS — J42 Unspecified chronic bronchitis: Secondary | ICD-10-CM | POA: Diagnosis not present

## 2018-12-25 DIAGNOSIS — I77810 Thoracic aortic ectasia: Secondary | ICD-10-CM | POA: Diagnosis not present

## 2018-12-25 DIAGNOSIS — Z881 Allergy status to other antibiotic agents status: Secondary | ICD-10-CM | POA: Diagnosis not present

## 2018-12-25 DIAGNOSIS — Z7951 Long term (current) use of inhaled steroids: Secondary | ICD-10-CM | POA: Diagnosis not present

## 2018-12-25 DIAGNOSIS — J439 Emphysema, unspecified: Secondary | ICD-10-CM | POA: Diagnosis not present

## 2018-12-25 DIAGNOSIS — I471 Supraventricular tachycardia: Secondary | ICD-10-CM | POA: Diagnosis not present

## 2018-12-25 DIAGNOSIS — I2510 Atherosclerotic heart disease of native coronary artery without angina pectoris: Secondary | ICD-10-CM | POA: Diagnosis not present

## 2018-12-25 DIAGNOSIS — Z7902 Long term (current) use of antithrombotics/antiplatelets: Secondary | ICD-10-CM | POA: Diagnosis not present

## 2018-12-25 DIAGNOSIS — Z79899 Other long term (current) drug therapy: Secondary | ICD-10-CM | POA: Diagnosis not present

## 2018-12-25 DIAGNOSIS — R0789 Other chest pain: Secondary | ICD-10-CM | POA: Diagnosis not present

## 2018-12-25 DIAGNOSIS — Z87891 Personal history of nicotine dependence: Secondary | ICD-10-CM | POA: Diagnosis not present

## 2018-12-25 DIAGNOSIS — Z791 Long term (current) use of non-steroidal anti-inflammatories (NSAID): Secondary | ICD-10-CM | POA: Diagnosis not present

## 2018-12-25 DIAGNOSIS — E785 Hyperlipidemia, unspecified: Secondary | ICD-10-CM | POA: Diagnosis not present

## 2018-12-25 DIAGNOSIS — Z888 Allergy status to other drugs, medicaments and biological substances status: Secondary | ICD-10-CM | POA: Diagnosis not present

## 2019-01-14 DIAGNOSIS — I77810 Thoracic aortic ectasia: Secondary | ICD-10-CM | POA: Diagnosis not present

## 2019-01-14 DIAGNOSIS — I2510 Atherosclerotic heart disease of native coronary artery without angina pectoris: Secondary | ICD-10-CM | POA: Diagnosis not present

## 2019-01-14 DIAGNOSIS — I471 Supraventricular tachycardia: Secondary | ICD-10-CM | POA: Diagnosis not present

## 2019-01-14 DIAGNOSIS — J439 Emphysema, unspecified: Secondary | ICD-10-CM | POA: Diagnosis not present

## 2019-01-14 DIAGNOSIS — I739 Peripheral vascular disease, unspecified: Secondary | ICD-10-CM | POA: Diagnosis not present

## 2019-01-14 DIAGNOSIS — I10 Essential (primary) hypertension: Secondary | ICD-10-CM | POA: Diagnosis not present

## 2019-02-02 ENCOUNTER — Other Ambulatory Visit: Payer: Self-pay | Admitting: Vascular Surgery

## 2019-02-02 DIAGNOSIS — R062 Wheezing: Secondary | ICD-10-CM | POA: Diagnosis not present

## 2019-02-02 DIAGNOSIS — R918 Other nonspecific abnormal finding of lung field: Secondary | ICD-10-CM | POA: Diagnosis not present

## 2019-02-02 DIAGNOSIS — Z9889 Other specified postprocedural states: Secondary | ICD-10-CM | POA: Diagnosis not present

## 2019-02-02 DIAGNOSIS — R0602 Shortness of breath: Secondary | ICD-10-CM | POA: Diagnosis not present

## 2019-02-02 DIAGNOSIS — R911 Solitary pulmonary nodule: Secondary | ICD-10-CM | POA: Diagnosis not present

## 2019-02-02 DIAGNOSIS — J449 Chronic obstructive pulmonary disease, unspecified: Secondary | ICD-10-CM | POA: Diagnosis not present

## 2019-02-02 DIAGNOSIS — I10 Essential (primary) hypertension: Secondary | ICD-10-CM | POA: Diagnosis not present

## 2019-02-02 DIAGNOSIS — N289 Disorder of kidney and ureter, unspecified: Secondary | ICD-10-CM | POA: Diagnosis not present

## 2019-02-02 DIAGNOSIS — J439 Emphysema, unspecified: Secondary | ICD-10-CM | POA: Diagnosis not present

## 2019-03-26 DIAGNOSIS — N281 Cyst of kidney, acquired: Secondary | ICD-10-CM | POA: Diagnosis not present

## 2019-03-26 DIAGNOSIS — I10 Essential (primary) hypertension: Secondary | ICD-10-CM | POA: Diagnosis not present

## 2019-04-17 DIAGNOSIS — N281 Cyst of kidney, acquired: Secondary | ICD-10-CM | POA: Diagnosis not present

## 2019-04-30 DIAGNOSIS — Z03818 Encounter for observation for suspected exposure to other biological agents ruled out: Secondary | ICD-10-CM | POA: Diagnosis not present

## 2019-04-30 DIAGNOSIS — R1032 Left lower quadrant pain: Secondary | ICD-10-CM | POA: Diagnosis not present

## 2019-04-30 DIAGNOSIS — K5792 Diverticulitis of intestine, part unspecified, without perforation or abscess without bleeding: Secondary | ICD-10-CM | POA: Diagnosis not present

## 2019-04-30 DIAGNOSIS — R509 Fever, unspecified: Secondary | ICD-10-CM | POA: Diagnosis not present

## 2019-04-30 DIAGNOSIS — R0602 Shortness of breath: Secondary | ICD-10-CM | POA: Diagnosis not present

## 2019-04-30 DIAGNOSIS — N281 Cyst of kidney, acquired: Secondary | ICD-10-CM | POA: Diagnosis not present

## 2019-04-30 DIAGNOSIS — J988 Other specified respiratory disorders: Secondary | ICD-10-CM | POA: Diagnosis not present

## 2019-04-30 DIAGNOSIS — I10 Essential (primary) hypertension: Secondary | ICD-10-CM | POA: Diagnosis not present

## 2019-04-30 DIAGNOSIS — R918 Other nonspecific abnormal finding of lung field: Secondary | ICD-10-CM | POA: Diagnosis not present

## 2019-08-31 ENCOUNTER — Telehealth: Payer: Self-pay | Admitting: *Deleted

## 2019-08-31 NOTE — Telephone Encounter (Signed)
Patient"s wife called stating patient is complaining of coldness to both feet and a "red spot" on his upper right thigh.  I spoke to Dr Myra Gianotti and he would like to order an ABI and an aortic iliac study. The lab staff had left for the day, however I explained to the patient's wife if he has worsening discoloration, pain or changes he should report to the ED.  I will speak to the scheduler tomorrow morning to have scheduled, along with an office visit.

## 2019-09-01 ENCOUNTER — Ambulatory Visit: Payer: BC Managed Care – PPO | Admitting: Physician Assistant

## 2019-09-01 ENCOUNTER — Ambulatory Visit (HOSPITAL_COMMUNITY)
Admission: RE | Admit: 2019-09-01 | Discharge: 2019-09-01 | Disposition: A | Discharge status: Home / Self Care | Payer: BC Managed Care – PPO | Source: Ambulatory Visit | Attending: Surgery | Admitting: Surgery

## 2019-09-01 ENCOUNTER — Other Ambulatory Visit: Payer: Self-pay | Admitting: *Deleted

## 2019-09-01 ENCOUNTER — Ambulatory Visit (INDEPENDENT_AMBULATORY_CARE_PROVIDER_SITE_OTHER)
Admission: RE | Admit: 2019-09-01 | Discharge: 2019-09-01 | Disposition: A | Discharge status: Home / Self Care | Payer: BC Managed Care – PPO | Source: Ambulatory Visit | Attending: Surgery | Admitting: Surgery

## 2019-09-01 ENCOUNTER — Other Ambulatory Visit: Payer: Self-pay

## 2019-09-01 VITALS — BP 136/79 | HR 107 | Temp 98.3°F | Resp 20 | Ht 71.0 in | Wt 176.0 lb

## 2019-09-01 DIAGNOSIS — I779 Disorder of arteries and arterioles, unspecified: Secondary | ICD-10-CM

## 2019-09-01 DIAGNOSIS — I739 Peripheral vascular disease, unspecified: Secondary | ICD-10-CM | POA: Diagnosis present

## 2019-09-01 NOTE — Progress Notes (Signed)
Office Note     CC:  Cold feet Requesting Provider:  No ref. provider found  HPI: [name] Ross is a 59 y.o. (1960/10/18) male who presents with several day history of cool sensation to both feet.  He denies actually touching his feet and feeling coolness.  He complains of right hip joint pain and right quadricep soreness.  He denies buttock or calf cramping, and denies rest pain.  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  He underwent aortogram in November 2018 with placement of right common iliac artery and right external iliac artery stents.  He was found to have bilateral SFA occlusion with reconstitution at the above-knee popliteal artery and bilateral three-vessel runoff.  He was last seen here in our office March 03, 2018 and advised to follow-up in 6 months.  The pt is on a statin for cholesterol management.  The pt is on a daily aspirin.   Other AC:  Pletal.  Plavix The pt is on ARB for hypertension.   The pt is not diabetic.   Tobacco hx:  Lifelong adult cigarette smoker and quit smoking using Chantix in 2019  Past Medical History:  Diagnosis Date  . Back pain   . Chronic bronchitis (HCC)   . COPD (chronic obstructive pulmonary disease) (HCC)   . GERD (gastroesophageal reflux disease)   . Multiple lung nodules   . Neuromuscular disorder (HCC)    leg- pain causing a reduced stamina for walking , due to leg pain.   . Tobacco abuse     Past Surgical History:  Procedure Laterality Date  . ABDOMINAL AORTOGRAM N/A 05/28/2017   Procedure: ABDOMINAL AORTOGRAM;  Surgeon: Nada Libman, MD;  Location: [facility] INVASIVE CV [facility];  Service: Cardiovascular;  Laterality: N/A;  . APPENDECTOMY  1980  . BACK SURGERY  1980's  . LOWER EXTREMITY ANGIOGRAPHY Bilateral 05/28/2017   Procedure: Lower Extremity Angiography;  Surgeon: Nada Libman, MD;  Location: [facility] INVASIVE CV LAB;  Service: Cardiovascular;  Laterality: Bilateral;  . PERIPHERAL VASCULAR INTERVENTION Right 05/28/2017   Procedure: PERIPHERAL VASCULAR INTERVENTION;  Surgeon: Nada Libman, MD;  Location: [facility] INVASIVE CV LAB;  Service: Cardiovascular;  Laterality: Right;  ext iliac  . VIDEO ASSISTED THORACOSCOPY (VATS)/WEDGE RESECTION Right 10/29/2016   Procedure: VIDEO ASSISTED THORACOSCOPY (VATS)/Right upper lobe wedge RESECTION;  Surgeon: Delight Ovens, MD;  Location: [facility] OR;  Service: Thoracic;  Laterality: Right;  Marland Kitchen VIDEO BRONCHOSCOPY N/A 10/29/2016   Procedure: VIDEO BRONCHOSCOPY;  Surgeon: Delight Ovens, MD;  Location: [facility] OR;  Service: Thoracic;  Laterality: N/A;  . VIDEO BRONCHOSCOPY WITH ENDOBRONCHIAL NAVIGATION N/A 04/02/2016   Procedure: VIDEO BRONCHOSCOPY WITH ENDOBRONCHIAL NAVIGATION;  Surgeon: Delight Ovens, MD;  Location: [facility] OR;  Service: Thoracic;  Laterality: N/A;    Social History   Socioeconomic History  . Marital status: Single    Spouse name: Not on file  . Number of children: Not on file  . Years of education: Not on file  . Highest education level: Not on file  Occupational History  . Not on file  Tobacco Use  . Smoking status: Current Every Day Smoker    Packs/day: 1.00    Years: 30.00    Pack years: 30.00  . Smokeless tobacco: Never Used  . Tobacco comment: Currently using Chantix  Substance and Sexual Activity  . Alcohol  use: Yes    Comment: daily- beer, 3-4 /day   . Drug use: No  . Sexual activity: Not on file  Other Topics Concern  . Not on file  Social History Narrative  . Not on file   Social Determinants of Health   Financial Resource Strain:   . Difficulty of Paying Living Expenses: Not on file  Food Insecurity:   . Worried About Programme researcher, broadcasting/film/video in the Last Year: Not on file  . Ran Out of Food in the Last Year: Not on file  Transportation Needs:   . Lack of Transportation (Medical): Not on file  .  Lack of Transportation (Non-Medical): Not on file  Physical Activity:   . Days of Exercise per Week: Not on file  . Minutes of Exercise per Session: Not on file  Stress:   . Feeling of Stress : Not on file  Social Connections:   . Frequency of Communication with Friends and Family: Not on file  . Frequency of Social Gatherings with Friends and Family: Not on file  . Attends Religious Services: Not on file  . Active Member of Clubs or Organizations: Not on file  . Attends Banker Meetings: Not on file  . Marital Status: Not on file  Intimate Partner Violence:   . Fear of Current or Ex-Partner: Not on file  . Emotionally Abused: Not on file  . Physically Abused: Not on file  . Sexually Abused: Not on file   No family history on file.  Current Outpatient Medications  Medication Sig Dispense Refill  . acetaminophen (TYLENOL) 500 MG tablet Take 1,000 mg by mouth every 6 (six) hours as needed (for pain.).     Marland Kitchen azelastine (ASTELIN) 0.1 % nasal spray Place 2 sprays into both nostrils 2 (two) times daily as needed for allergies.   11  . cilostazol (PLETAL) 100 MG tablet TAKE 1 TABLET BY MOUTH TWICE A DAY BEFORE A MEAL 180 tablet 3  . clopidogrel (PLAVIX) 75 MG tablet TAKE 1 TABLET BY MOUTH EVERY DAY 90 tablet 1  . HYDROcodone-acetaminophen (NORCO) 5-325 MG tablet Take 1 tablet every 4 (four) hours as needed by mouth for moderate pain. 15 tablet 0  . olmesartan (BENICAR) 40 MG tablet Take 40 mg by mouth daily.  0  . ranitidine (ZANTAC) 300 MG tablet Take 300 mg by mouth daily.  3  . rosuvastatin (CRESTOR) 5 MG tablet Take 5 mg by mouth daily.  0  . tiZANidine (ZANAFLEX) 4 MG tablet Take 4 mg by mouth two times a day as needed for muscle spasms  0   No current facility-administered medications for this visit.    Allergies  Allergen Reactions  . Codeine Hives  . Demerol [Meperidine] Hives     REVIEW OF SYSTEMS:   [X]  denotes positive finding, [ ]  denotes negative  finding Cardiac  Comments:  Chest pain or chest pressure:    Shortness of breath upon exertion:    Short of breath when lying flat:    Irregular heart rhythm:        Vascular    Pain in calf, thigh, or hip brought on by ambulation:    Pain in feet at night that wakes you up from your sleep:     Blood clot in your veins:    Leg swelling:         Pulmonary    Oxygen at home:    Productive cough:     Wheezing:  Neurologic    Sudden weakness in arms or legs:     Sudden numbness in arms or legs:     Sudden onset of difficulty speaking or slurred speech:    Temporary loss of vision in one eye:     Problems with dizziness:         Gastrointestinal    Blood in stool:     Vomited blood:         Genitourinary    Burning when urinating:     Blood in urine:        Psychiatric    Major depression:         Hematologic    Bleeding problems:    Problems with blood clotting too easily:        Skin    Rashes or ulcers:        Constitutional    Fever or chills:      PHYSICAL EXAMINATION:  Vitals:   09/01/19 1208  Weight: 176 lb (79.8 kg)  Height: 5\' 11"  (1.803 m)   General:  WDWN in NAD; vital signs documented above Gait: Not observed HENT: WNL, normocephalic Pulmonary: normal non-labored breathing , without Rales, rhonchi,  wheezing Cardiac: Slightly elevated heart rate, normal rhythm Skin: without rashes Vascular Exam/Pulses:  Right Left  Radial 2+ (normal) 2+ (normal)  Ulnar Not eval Not eval  Femoral 2+ (normal) 2+ (normal)  Popliteal absent absent  DP absent absent  PT absent absent   Extremities: with chronic ischemic changes>>lack of hair, without Gangrene , without cellulitis; without open wounds;  Musculoskeletal: no muscle wasting or atrophy  Neurologic: A&O X 3;  No focal weakness or paresthesias are detected Psychiatric:  The pt has Normal affect.   Non-Invasive Vascular Imaging:   ABI/TBIToday's ABIToday's TBIPrevious ABIPrevious TBI    +-------+-----------+-----------+------------+------------+  Right 0.72    0.55    0.89    0.47      +-------+-----------+-----------+------------+------------+  Left  0.75    0.57    0.87    0.65      Right Stent(s):  +---------------+--------+--------+--------+--------+  CIA stent   PSV cm/sStenosisWaveformComments  +---------------+--------+--------+--------+--------+  Prox to Stent 206       biphasic      +---------------+--------+--------+--------+--------+  Proximal Stent 115       biphasic      +---------------+--------+--------+--------+--------+  Mid Stent   134       biphasic      +---------------+--------+--------+--------+--------+  Distal Stent  142       biphasic      +---------------+--------+--------+--------+--------+  Distal to Stent170       biphasic      +---------------+--------+--------+--------+--------+   Distal aorta 59 cm/s biphasic waveform.  Right CFA 166 cm/s biphasic waveform. Left CFA 182 triphasic waveform.        Right Stent(s):  +---------------+--------+--------+--------+--------+  EIA stent   PSV cm/sStenosisWaveformComments  +---------------+--------+--------+--------+--------+  Prox to Stent 166                 +---------------+--------+--------+--------+--------+  Proximal Stent 132       biphasic      +---------------+--------+--------+--------+--------+  Mid Stent   110       biphasic      +---------------+--------+--------+--------+--------+  Distal Stent  202       biphasic      +---------------+--------+--------+--------+--------+  Distal to Stent170       biphasic      +---------------+--------+--------+--------+--------+     Summary:  IVC/Iliac: Patent right common  iliac and external iliac  artery stents with  no visualized stenosis.  Patent left common and external iliac arteries, no stenosis visualized     ASSESSMENT/PLAN:: 59 y.o. male here for follow up for PAD status post right common iliac and right external iliac artery stents in August 2019.  Today he has no signs or symptoms of rest pain, ulcers or limiting claudication.  Right ABI decreased from August 2019 with biphasic waveform.  Left ABI also decreased 2.75 with biphasic waveform.  Toe pressures right: 74,  Left: 77.  His hip and thigh pain do not appear to be vascular in nature.  I suggested he follow-up with his primary care if symptoms worsen.  We also reviewed signs and symptoms of acute lower extremity ischemia.  Continue medication as prescribed and exercise   -Follow-up 1 year or sooner if needed   Barbie Banner, PA-C [facility] [phone]  Clinic MD:   Carlis Abbott

## 2019-09-02 ENCOUNTER — Other Ambulatory Visit: Payer: Self-pay | Admitting: *Deleted

## 2019-09-02 DIAGNOSIS — I739 Peripheral vascular disease, unspecified: Secondary | ICD-10-CM

## 2019-09-02 DIAGNOSIS — I70213 Atherosclerosis of native arteries of extremities with intermittent claudication, bilateral legs: Secondary | ICD-10-CM

## 2020-02-11 ENCOUNTER — Other Ambulatory Visit: Payer: Self-pay | Admitting: Vascular Surgery

## 2020-09-26 DIAGNOSIS — W19XXXA Unspecified fall, initial encounter: Secondary | ICD-10-CM | POA: Diagnosis not present

## 2020-09-26 DIAGNOSIS — M25532 Pain in left wrist: Secondary | ICD-10-CM | POA: Diagnosis not present

## 2020-09-26 DIAGNOSIS — M25512 Pain in left shoulder: Secondary | ICD-10-CM | POA: Diagnosis not present

## 2020-09-26 DIAGNOSIS — M898X1 Other specified disorders of bone, shoulder: Secondary | ICD-10-CM | POA: Diagnosis not present

## 2020-09-26 DIAGNOSIS — R52 Pain, unspecified: Secondary | ICD-10-CM | POA: Diagnosis not present

## 2020-09-27 DIAGNOSIS — M25512 Pain in left shoulder: Secondary | ICD-10-CM | POA: Diagnosis not present

## 2020-09-27 DIAGNOSIS — M7542 Impingement syndrome of left shoulder: Secondary | ICD-10-CM | POA: Diagnosis not present

## 2020-12-01 DIAGNOSIS — R062 Wheezing: Secondary | ICD-10-CM | POA: Diagnosis not present

## 2020-12-01 DIAGNOSIS — R509 Fever, unspecified: Secondary | ICD-10-CM | POA: Diagnosis not present

## 2020-12-01 DIAGNOSIS — R5381 Other malaise: Secondary | ICD-10-CM | POA: Diagnosis not present

## 2020-12-01 DIAGNOSIS — R0602 Shortness of breath: Secondary | ICD-10-CM | POA: Diagnosis not present

## 2020-12-01 DIAGNOSIS — R059 Cough, unspecified: Secondary | ICD-10-CM | POA: Diagnosis not present

## 2020-12-14 DIAGNOSIS — E782 Mixed hyperlipidemia: Secondary | ICD-10-CM | POA: Diagnosis not present

## 2020-12-14 DIAGNOSIS — I10 Essential (primary) hypertension: Secondary | ICD-10-CM | POA: Diagnosis not present

## 2020-12-14 DIAGNOSIS — R7301 Impaired fasting glucose: Secondary | ICD-10-CM | POA: Diagnosis not present

## 2020-12-14 DIAGNOSIS — R238 Other skin changes: Secondary | ICD-10-CM | POA: Diagnosis not present

## 2020-12-14 DIAGNOSIS — Z0000 Encounter for general adult medical examination without abnormal findings: Secondary | ICD-10-CM | POA: Diagnosis not present

## 2020-12-14 DIAGNOSIS — N400 Enlarged prostate without lower urinary tract symptoms: Secondary | ICD-10-CM | POA: Diagnosis not present

## 2021-02-07 DIAGNOSIS — I10 Essential (primary) hypertension: Secondary | ICD-10-CM | POA: Diagnosis not present

## 2021-02-07 DIAGNOSIS — M7542 Impingement syndrome of left shoulder: Secondary | ICD-10-CM | POA: Diagnosis not present

## 2021-02-07 DIAGNOSIS — M25512 Pain in left shoulder: Secondary | ICD-10-CM | POA: Diagnosis not present

## 2021-02-20 DIAGNOSIS — M7552 Bursitis of left shoulder: Secondary | ICD-10-CM | POA: Diagnosis not present

## 2021-02-20 DIAGNOSIS — M19012 Primary osteoarthritis, left shoulder: Secondary | ICD-10-CM | POA: Diagnosis not present

## 2021-02-20 DIAGNOSIS — M879 Osteonecrosis, unspecified: Secondary | ICD-10-CM | POA: Diagnosis not present

## 2021-02-20 DIAGNOSIS — M75112 Incomplete rotator cuff tear or rupture of left shoulder, not specified as traumatic: Secondary | ICD-10-CM | POA: Diagnosis not present

## 2021-02-21 DIAGNOSIS — I10 Essential (primary) hypertension: Secondary | ICD-10-CM | POA: Diagnosis not present

## 2021-02-21 DIAGNOSIS — M75122 Complete rotator cuff tear or rupture of left shoulder, not specified as traumatic: Secondary | ICD-10-CM | POA: Diagnosis not present

## 2021-03-07 DIAGNOSIS — M75102 Unspecified rotator cuff tear or rupture of left shoulder, not specified as traumatic: Secondary | ICD-10-CM | POA: Diagnosis not present

## 2021-03-16 DIAGNOSIS — I2510 Atherosclerotic heart disease of native coronary artery without angina pectoris: Secondary | ICD-10-CM | POA: Diagnosis not present

## 2021-03-16 DIAGNOSIS — I471 Supraventricular tachycardia: Secondary | ICD-10-CM | POA: Diagnosis not present

## 2021-03-16 DIAGNOSIS — F17210 Nicotine dependence, cigarettes, uncomplicated: Secondary | ICD-10-CM | POA: Diagnosis not present

## 2021-03-16 DIAGNOSIS — Z881 Allergy status to other antibiotic agents status: Secondary | ICD-10-CM | POA: Diagnosis not present

## 2021-03-16 DIAGNOSIS — S46212A Strain of muscle, fascia and tendon of other parts of biceps, left arm, initial encounter: Secondary | ICD-10-CM | POA: Diagnosis not present

## 2021-03-16 DIAGNOSIS — E785 Hyperlipidemia, unspecified: Secondary | ICD-10-CM | POA: Diagnosis not present

## 2021-03-16 DIAGNOSIS — M25812 Other specified joint disorders, left shoulder: Secondary | ICD-10-CM | POA: Diagnosis not present

## 2021-03-16 DIAGNOSIS — Z79899 Other long term (current) drug therapy: Secondary | ICD-10-CM | POA: Diagnosis not present

## 2021-03-16 DIAGNOSIS — M75112 Incomplete rotator cuff tear or rupture of left shoulder, not specified as traumatic: Secondary | ICD-10-CM | POA: Diagnosis not present

## 2021-03-16 DIAGNOSIS — G8918 Other acute postprocedural pain: Secondary | ICD-10-CM | POA: Diagnosis not present

## 2021-03-16 DIAGNOSIS — I10 Essential (primary) hypertension: Secondary | ICD-10-CM | POA: Diagnosis not present

## 2021-03-16 DIAGNOSIS — M75102 Unspecified rotator cuff tear or rupture of left shoulder, not specified as traumatic: Secondary | ICD-10-CM | POA: Diagnosis not present

## 2021-03-16 DIAGNOSIS — J439 Emphysema, unspecified: Secondary | ICD-10-CM | POA: Diagnosis not present

## 2021-03-16 DIAGNOSIS — M7552 Bursitis of left shoulder: Secondary | ICD-10-CM | POA: Diagnosis not present

## 2021-03-16 DIAGNOSIS — Z7982 Long term (current) use of aspirin: Secondary | ICD-10-CM | POA: Diagnosis not present

## 2021-03-16 DIAGNOSIS — Z7902 Long term (current) use of antithrombotics/antiplatelets: Secondary | ICD-10-CM | POA: Diagnosis not present

## 2021-03-16 DIAGNOSIS — M19012 Primary osteoarthritis, left shoulder: Secondary | ICD-10-CM | POA: Diagnosis not present

## 2021-03-16 DIAGNOSIS — Z888 Allergy status to other drugs, medicaments and biological substances status: Secondary | ICD-10-CM | POA: Diagnosis not present

## 2021-03-16 DIAGNOSIS — M65812 Other synovitis and tenosynovitis, left shoulder: Secondary | ICD-10-CM | POA: Diagnosis not present

## 2021-03-16 DIAGNOSIS — Z885 Allergy status to narcotic agent status: Secondary | ICD-10-CM | POA: Diagnosis not present

## 2021-03-23 DIAGNOSIS — M25612 Stiffness of left shoulder, not elsewhere classified: Secondary | ICD-10-CM | POA: Diagnosis not present

## 2021-03-23 DIAGNOSIS — M25512 Pain in left shoulder: Secondary | ICD-10-CM | POA: Diagnosis not present

## 2021-03-23 DIAGNOSIS — M7542 Impingement syndrome of left shoulder: Secondary | ICD-10-CM | POA: Diagnosis not present

## 2021-03-23 DIAGNOSIS — Z4789 Encounter for other orthopedic aftercare: Secondary | ICD-10-CM | POA: Diagnosis not present

## 2021-03-28 DIAGNOSIS — M75122 Complete rotator cuff tear or rupture of left shoulder, not specified as traumatic: Secondary | ICD-10-CM | POA: Diagnosis not present

## 2021-03-28 DIAGNOSIS — Z4789 Encounter for other orthopedic aftercare: Secondary | ICD-10-CM | POA: Diagnosis not present

## 2021-03-29 DIAGNOSIS — M75102 Unspecified rotator cuff tear or rupture of left shoulder, not specified as traumatic: Secondary | ICD-10-CM | POA: Diagnosis not present

## 2021-03-30 DIAGNOSIS — M75122 Complete rotator cuff tear or rupture of left shoulder, not specified as traumatic: Secondary | ICD-10-CM | POA: Diagnosis not present

## 2021-03-30 DIAGNOSIS — Z4789 Encounter for other orthopedic aftercare: Secondary | ICD-10-CM | POA: Diagnosis not present

## 2021-04-04 DIAGNOSIS — M75122 Complete rotator cuff tear or rupture of left shoulder, not specified as traumatic: Secondary | ICD-10-CM | POA: Diagnosis not present

## 2021-04-04 DIAGNOSIS — Z4789 Encounter for other orthopedic aftercare: Secondary | ICD-10-CM | POA: Diagnosis not present

## 2021-04-11 DIAGNOSIS — M75122 Complete rotator cuff tear or rupture of left shoulder, not specified as traumatic: Secondary | ICD-10-CM | POA: Diagnosis not present

## 2021-04-11 DIAGNOSIS — Z4789 Encounter for other orthopedic aftercare: Secondary | ICD-10-CM | POA: Diagnosis not present

## 2021-04-12 DIAGNOSIS — I2510 Atherosclerotic heart disease of native coronary artery without angina pectoris: Secondary | ICD-10-CM | POA: Diagnosis not present

## 2021-04-12 DIAGNOSIS — Z87891 Personal history of nicotine dependence: Secondary | ICD-10-CM | POA: Diagnosis not present

## 2021-04-12 DIAGNOSIS — E785 Hyperlipidemia, unspecified: Secondary | ICD-10-CM | POA: Diagnosis not present

## 2021-04-12 DIAGNOSIS — I10 Essential (primary) hypertension: Secondary | ICD-10-CM | POA: Diagnosis not present

## 2021-04-12 DIAGNOSIS — I471 Supraventricular tachycardia: Secondary | ICD-10-CM | POA: Diagnosis not present

## 2021-04-13 DIAGNOSIS — Z4789 Encounter for other orthopedic aftercare: Secondary | ICD-10-CM | POA: Diagnosis not present

## 2021-04-13 DIAGNOSIS — M75122 Complete rotator cuff tear or rupture of left shoulder, not specified as traumatic: Secondary | ICD-10-CM | POA: Diagnosis not present

## 2021-04-18 DIAGNOSIS — Z4789 Encounter for other orthopedic aftercare: Secondary | ICD-10-CM | POA: Diagnosis not present

## 2021-04-18 DIAGNOSIS — M75122 Complete rotator cuff tear or rupture of left shoulder, not specified as traumatic: Secondary | ICD-10-CM | POA: Diagnosis not present

## 2021-06-26 DIAGNOSIS — I10 Essential (primary) hypertension: Secondary | ICD-10-CM | POA: Diagnosis not present

## 2021-06-26 DIAGNOSIS — R7301 Impaired fasting glucose: Secondary | ICD-10-CM | POA: Diagnosis not present

## 2021-06-26 DIAGNOSIS — E782 Mixed hyperlipidemia: Secondary | ICD-10-CM | POA: Diagnosis not present

## 2021-07-05 DIAGNOSIS — R0981 Nasal congestion: Secondary | ICD-10-CM | POA: Diagnosis not present

## 2021-07-05 DIAGNOSIS — R051 Acute cough: Secondary | ICD-10-CM | POA: Diagnosis not present

## 2021-07-05 DIAGNOSIS — J189 Pneumonia, unspecified organism: Secondary | ICD-10-CM | POA: Diagnosis not present

## 2021-07-05 DIAGNOSIS — R509 Fever, unspecified: Secondary | ICD-10-CM | POA: Diagnosis not present

## 2021-07-05 DIAGNOSIS — J019 Acute sinusitis, unspecified: Secondary | ICD-10-CM | POA: Diagnosis not present

## 2021-07-05 DIAGNOSIS — I10 Essential (primary) hypertension: Secondary | ICD-10-CM | POA: Diagnosis not present

## 2021-10-23 DIAGNOSIS — R051 Acute cough: Secondary | ICD-10-CM | POA: Diagnosis not present

## 2021-10-23 DIAGNOSIS — R0981 Nasal congestion: Secondary | ICD-10-CM | POA: Diagnosis not present

## 2021-10-23 DIAGNOSIS — R0989 Other specified symptoms and signs involving the circulatory and respiratory systems: Secondary | ICD-10-CM | POA: Diagnosis not present

## 2021-10-23 DIAGNOSIS — J42 Unspecified chronic bronchitis: Secondary | ICD-10-CM | POA: Diagnosis not present

## 2021-11-24 DIAGNOSIS — R062 Wheezing: Secondary | ICD-10-CM | POA: Diagnosis not present

## 2021-11-24 DIAGNOSIS — J208 Acute bronchitis due to other specified organisms: Secondary | ICD-10-CM | POA: Diagnosis not present

## 2021-11-24 DIAGNOSIS — B9689 Other specified bacterial agents as the cause of diseases classified elsewhere: Secondary | ICD-10-CM | POA: Diagnosis not present

## 2021-11-28 DIAGNOSIS — Z122 Encounter for screening for malignant neoplasm of respiratory organs: Secondary | ICD-10-CM | POA: Diagnosis not present

## 2021-11-28 DIAGNOSIS — R059 Cough, unspecified: Secondary | ICD-10-CM | POA: Diagnosis not present

## 2021-11-28 DIAGNOSIS — J449 Chronic obstructive pulmonary disease, unspecified: Secondary | ICD-10-CM | POA: Diagnosis not present

## 2021-11-28 DIAGNOSIS — Z720 Tobacco use: Secondary | ICD-10-CM | POA: Diagnosis not present

## 2021-12-20 DIAGNOSIS — J449 Chronic obstructive pulmonary disease, unspecified: Secondary | ICD-10-CM | POA: Diagnosis not present

## 2021-12-20 DIAGNOSIS — J439 Emphysema, unspecified: Secondary | ICD-10-CM | POA: Diagnosis not present

## 2022-02-18 ENCOUNTER — Encounter: Payer: Self-pay | Admitting: Emergency Medicine

## 2022-02-18 ENCOUNTER — Ambulatory Visit
Admission: EM | Admit: 2022-02-18 | Discharge: 2022-02-18 | Disposition: A | Discharge status: Home / Self Care | Payer: BC Managed Care – PPO | Attending: Physician Assistant | Admitting: Physician Assistant

## 2022-02-18 DIAGNOSIS — M25511 Pain in right shoulder: Secondary | ICD-10-CM

## 2022-02-18 MED ORDER — CYCLOBENZAPRINE HCL 5 MG PO TABS: 10.0000 mg | ORAL_TABLET | Freq: Three times a day (TID) | ORAL | 0 refills | Status: AC | PRN

## 2022-02-18 NOTE — ED Provider Notes (Signed)
[facility]    CSN: [id number] Arrival date & time: 02/18/22  1434      History   Chief Complaint Chief Complaint  Patient presents with   Arm Pain    HPI [name] Ross is a 61 y.o. male.   Pt complains of right shoulder pain that started yesterday, reports he was racing his son across the pool when he felt a pull in his shoulder immediate pain.  He reports pain with movement.  He denies numbness or tingling.  No problems with this shoulder prior to this injury.  He has had a torn rotator cuff to the left previously.      Past Medical History:  Diagnosis Date   Back pain    Chronic bronchitis (HCC)    COPD (chronic obstructive pulmonary disease) (HCC)    GERD (gastroesophageal reflux disease)    Multiple lung nodules    Neuromuscular disorder (HCC)    leg- pain causing a reduced stamina for walking , due to leg pain.    Tobacco abuse     Patient Active Problem List   Diagnosis Date Noted   Encounter for chest tube removal    Pneumothorax    Postop check    Acid fast bacillus    Smoker    Chronic bronchitis (HCC)    S/P partial lobectomy of lung 10/29/2016   Nodule of right lung 10/25/2016   COPD (chronic obstructive pulmonary disease) with emphysema (HCC) 10/25/2016    Past Surgical History:  Procedure Laterality Date   ABDOMINAL AORTOGRAM N/A 05/28/2017   Procedure: ABDOMINAL AORTOGRAM;  Surgeon: Nada Libman, MD;  Location: [facility] INVASIVE CV LAB;  Service: Cardiovascular;  Laterality: N/A;   APPENDECTOMY  1980   BACK SURGERY  1980's   LOWER EXTREMITY ANGIOGRAPHY Bilateral 05/28/2017   Procedure: Lower Extremity Angiography;  Surgeon: Nada Libman, MD;  Location: [facility]VAS[facility] CV [facility];  Service: Cardiovascular;  Laterality: Bilateral;   PERIPHERAL VASCULAR INTERVENTION Right 05/28/2017   Procedure: PERIPHERAL VASCULAR INTERVENTION;  Surgeon: Nada Libman, MD;  Location: [facility] INV[facility];  Service: Cardiovascular;  Laterality: Right;  ext  iliac   VIDEO ASSISTED THORACOSCOPY (VATS)/WEDGE RESECTION Right 10/29/2016   Procedure: VIDEO ASSISTED THORACOSCOPY (VATS)/Right upper lobe wedge RESECTION;  Surgeon: Delight Ovens, MD;  Location: [facility] OR;  Service: Thoracic;  Laterality: Right;   VIDEO BRONCHOSCOPY N/A 10/29/2016   Procedure: VIDEO BRONCHOSCOPY;  Surgeon: Delight Ovens, MD;  Location: [facility] OR;  Service: Thoracic;  Laterality: N/A;   VIDEO BRONCHOSCOPY WITH ENDOBRONCHIAL NAVIGATION N/A 04/02/2016   Procedure: VIDEO BRONCHOSCOPY WITH ENDOBRONCHIAL NAVIGATION;  Surgeon: Delight Ovens, MD;  Location: [facility] OR;  Service: Thoracic;  Laterality: N/A;       Home Medications    Prior to Admission medications   Medication Sig Start Date End Date Taking? Authorizing Provider  cyclobenzaprine (FLEXERIL) 5 MG tablet Take 2 tablets (10 mg total) by mouth 3 (three) times daily as needed for up to 10 days for muscle spasms. 02/18/22 02/28/22 Yes Ward, Tylene Fantasia, PA-C  acetaminophen (TYLENOL) 500 MG tablet Take 1,000 mg by mouth every 6 (six) hours as needed (for pain.).     [provider]  azelastine (ASTELIN) 0.1 % nasal spray Place 2 sprays into both nostrils 2 (two) times daily as needed for allergies.  09/28/16   [provider]  cilostazol (PLETAL) 100 MG tablet TAKE 1 TABLET BY MOUTH TWICE A DAY BEFORE A MEAL 02/18/20  Maeola Harman, MD  clopidogrel (PLAVIX) 75 MG tablet TAKE 1 TABLET BY MOUTH EVERY DAY 03/21/18   Nickel, Carma Lair, NP  HYDROcodone-acetaminophen (NORCO) 5-325 MG tablet Take 1 tablet every 4 (four) hours as needed by mouth for moderate pain. 05/30/17   Dione Booze, MD  olmesartan (BENICAR) 40 MG tablet Take 40 mg by mouth daily. 04/29/17   [provider]  ranitidine (ZANTAC) 300 MG tablet Take 300 mg by mouth daily. 03/05/17   [provider]  rosuvastatin (CRESTOR) 5 MG tablet Take 5 mg by mouth daily. 05/13/17   [provider]    Family History History  reviewed. No pertinent family history.  Social History Social History   Tobacco Use   Smoking status: Every Day    Packs/day: 1.00    Years: 30.00    Total pack years: 30.00    Types: Cigarettes   Smokeless tobacco: Never   Tobacco comments:    Currently using Chantix  Vaping Use   Vaping Use: Every day  Substance Use Topics   Alcohol use: Yes    Comment: daily- beer, 3-4 /day    Drug use: No     Allergies   Codeine and Demerol [meperidine]   Review of Systems Review of Systems  Constitutional:  Negative for chills and fever.  HENT:  Negative for ear pain and sore throat.   Eyes:  Negative for pain and visual disturbance.  Respiratory:  Negative for cough and shortness of breath.   Cardiovascular:  Negative for chest pain and palpitations.  Gastrointestinal:  Negative for abdominal pain and vomiting.  Genitourinary:  Negative for dysuria and hematuria.  Musculoskeletal:  Positive for arthralgias (right shoulder pain). Negative for back pain.  Skin:  Negative for color change and rash.  Neurological:  Negative for seizures and syncope.  All other systems reviewed and are negative.    Physical Exam Triage Vital Signs ED Triage Vitals  Enc Vitals Group     BP 02/18/22 1500 (!) 164/91     Pulse Rate 02/18/22 1500 93     Resp 02/18/22 1500 17     Temp 02/18/22 1500 97.7 F (36.5 C)     Temp src --      SpO2 02/18/22 1500 97 %     Weight --      Height --      Head Circumference --      Peak Flow --      Pain Score 02/18/22 1459 9     Pain Loc --      Pain Edu? --      Excl. in GC? --    No data found.  Updated Vital Signs BP (!) 164/91   Pulse 93   Temp 97.7 F (36.5 C)   Resp 17   SpO2 97%   Visual Acuity Right Eye Distance:   Left Eye Distance:   Bilateral Distance:    Right Eye Near:   Left Eye Near:    Bilateral Near:     Physical Exam Vitals and nursing note reviewed.  Constitutional:      General: He is not in acute distress.     Appearance: He is well-developed.  HENT:     Head: Normocephalic and atraumatic.  Eyes:     Conjunctiva/sclera: Conjunctivae normal.  Cardiovascular:     Rate and Rhythm: Normal rate and regular rhythm.     Heart sounds: No murmur heard. Pulmonary:     Effort: Pulmonary  effort is normal. No respiratory distress.     Breath sounds: Normal breath sounds.  Abdominal:     Palpations: Abdomen is soft.     Tenderness: There is no abdominal tenderness.  Musculoskeletal:        General: No swelling.     Right shoulder: No swelling or bony tenderness. Normal pulse.     Left shoulder: Normal.     Cervical back: Neck supple.     Comments: Pain with external rotation, decreased strength with external rotation.    Skin:    General: Skin is warm and dry.     Capillary Refill: Capillary refill takes less than 2 seconds.  Neurological:     Mental Status: He is alert.  Psychiatric:        Mood and Affect: Mood normal.      UC Treatments / Results  Labs (all labs ordered are listed, but only abnormal results are displayed) Labs Reviewed - No data to display  EKG   Radiology No results found.  Procedures Procedures (including critical care time)  Medications Ordered in UC Medications - No data to display  Initial Impression / Assessment and Plan / UC Course  I have reviewed the triage vital signs and the nursing notes.  Pertinent labs & imaging results that were available during my care of the patient were reviewed by me and considered in my medical decision making (see chart for details).     Right shoulder pain, possible rotator cuff injury. Conservative treatment discussed. If no improvement advised follow up with ortho.  Return precautions discussed.  Final Clinical Impressions(s) / UC Diagnoses   Final diagnoses:  Acute pain of right shoulder     Discharge Instructions      Can apply ice to right shoulder Can take Tylenol as needed for pain Flexeril prescribed to  take as needed for muscle spasm Perform range of motion exercises as discussed If no improvement follow up with orthopedics    ED Prescriptions     Medication Sig Dispense Auth. Provider   cyclobenzaprine (FLEXERIL) 5 MG tablet Take 2 tablets (10 mg total) by mouth 3 (three) times daily as needed for up to 10 days for muscle spasms. 30 tablet Ward, Tylene Fantasia, PA-C      PDMP not reviewed this encounter.   Ward, Tylene Fantasia, PA-C 02/18/22 [phone]

## 2022-02-18 NOTE — ED Triage Notes (Signed)
Pt is present today with c/o right arm pain. Pt states that he was swimming yesterday and thinks he may have pulled a muscle.

## 2022-02-18 NOTE — Discharge Instructions (Signed)
Can apply ice to right shoulder Can take Tylenol as needed for pain Flexeril prescribed to take as needed for muscle spasm Perform range of motion exercises as discussed If no improvement follow up with orthopedics

## 2022-02-27 ENCOUNTER — Ambulatory Visit: Payer: BC Managed Care – PPO | Admitting: Family Medicine

## 2022-02-27 ENCOUNTER — Encounter: Payer: Self-pay | Admitting: Family Medicine

## 2022-02-27 VITALS — BP 122/74 | HR 96 | Temp 97.7°F | Ht 71.0 in | Wt 179.6 lb

## 2022-02-27 DIAGNOSIS — I739 Peripheral vascular disease, unspecified: Secondary | ICD-10-CM | POA: Insufficient documentation

## 2022-02-27 DIAGNOSIS — J209 Acute bronchitis, unspecified: Secondary | ICD-10-CM

## 2022-02-27 DIAGNOSIS — J440 Chronic obstructive pulmonary disease with acute lower respiratory infection: Secondary | ICD-10-CM | POA: Diagnosis not present

## 2022-02-27 DIAGNOSIS — Z720 Tobacco use: Secondary | ICD-10-CM

## 2022-02-27 DIAGNOSIS — M25519 Pain in unspecified shoulder: Secondary | ICD-10-CM

## 2022-02-27 DIAGNOSIS — I10 Essential (primary) hypertension: Secondary | ICD-10-CM

## 2022-02-27 DIAGNOSIS — E7800 Pure hypercholesterolemia, unspecified: Secondary | ICD-10-CM

## 2022-02-27 NOTE — Progress Notes (Signed)
New Patient Office Visit  Subjective    Patient ID: [name] Ross, male    DOB: [birth year]  Age: 61 y.o. MRN: [id number]  CC:  Chief Complaint  Patient presents with   Establish Care    NP/establish care, no concerns. Patient not fasting.     HPI [name] Ross presents to establish care He is accompanied by his wife today.  Past medical history of hypertension, elevated heart rate, peripheral vascular disease, COPD, tobacco use and dyspnea on exertion.  He has seen a cardiologist in the past for treatment of his hypertension and elevated pulse rate.  Currently taking olmesartan and diltiazem.  Diagnosed with vascular disease and what sounds like his pelvic arteries.  He is seeing pulmonology for COPD.  Benign lesion was excised from his lung 3 years ago.  He continues to experience dyspnea on exertion.  He has follow-up planned with pulmonology in the next few weeks.  CT scan will be scheduled.  Continues to smoke 3 cigarettes daily.  He has 5-6 beers on the weekend days.  Wife is not concerned about his alcohol usage.  He lives with his wife and 3 children ages 9, 36 and 49.  He works as a Merchandiser, retail in [employer].  He is active around his house large yard.  They have a treadmill at home.  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  Ongoing shoulder issues.  He has seen an orthopedist who has recommended he take meloxicam and Flexeril as needed.  Outpatient Encounter Medications as of 02/27/2022  Medication Sig   acetaminophen (TYLENOL) 500 MG tablet Take 1,000 mg by mouth every 6 (six) hours as needed (for pain.).    azelastine (ASTELIN) 0.1 % nasal spray Place 2 sprays into both nostrils 2 (two) times daily as needed for allergies.    cilostazol (PLETAL) 100 MG tablet TAKE 1 TABLET BY MOUTH TWICE A DAY BEFORE A MEAL   clopidogrel (PLAVIX) 75 MG tablet TAKE 1 TABLET BY MOUTH EVERY DAY   cyclobenzaprine (FLEXERIL) 5 MG tablet Take 2 tablets (10 mg total) by mouth 3 (three)  times daily as needed for up to 10 days for muscle spasms.   diltiazem (CARDIZEM CD) 120 MG 24 hr capsule Take 120 mg by mouth daily.   meloxicam (MOBIC) 15 MG tablet Take by mouth.   olmesartan (BENICAR) 40 MG tablet Take 40 mg by mouth daily.   omeprazole (PRILOSEC) 40 MG capsule Take 40 mg by mouth daily.   rosuvastatin (CRESTOR) 5 MG tablet Take 5 mg by mouth daily.   sildenafil (VIAGRA) 50 MG tablet SMARTSIG:1-2 Tablet(s) By Mouth   [DISCONTINUED] HYDROcodone-acetaminophen (NORCO) 5-325 MG tablet Take 1 tablet every 4 (four) hours as needed by mouth for moderate pain. (Patient not taking: Reported on 02/27/2022)   [DISCONTINUED] ranitidine (ZANTAC) 300 MG tablet Take 300 mg by mouth daily.   No facility-administered encounter medications on file as of 02/27/2022.    Past Medical History:  Diagnosis Date   Back pain    Chronic bronchitis (HCC)    COPD (chronic obstructive pulmonary disease) (HCC)    GERD (gastroesophageal reflux disease)    Multiple lung nodules    Neuromuscular disorder (HCC)    leg- pain causing a reduced stamina for walking , due to leg pain.    Tobacco abuse     Past Surgical History:  Procedure Laterality Date   ABDOMINAL AORTOGRAM N/A 05/28/2017   Procedure: ABDOMINAL AORTOGRAM;  Surgeon: Nada Libman, MD;  Location: [facility];  Service: Cardiovascular;  Laterality: N/A;   APPENDECTOMY  1980   BACK SURGERY  1980's   LOWER EXTREMITY ANGIOGRAPHY Bilateral 05/28/2017   Procedure: Lower Extremity Angiography;  Surgeon: Nada Libman, MD;  Location: [facility];  Service: Cardiovascular;  Laterality: Bilateral;   PERIPHERAL VASCULAR INTERVENTION Right 05/28/2017   Procedure: PERIPHERAL VASCULAR INTERVENTION;  Surgeon: Nada Libman, MD;  Location: [facility];  Service: Cardiovascular;  Laterality: Right;  ext iliac   VIDEO ASSISTED THORACOSCOPY (VATS)/WEDGE RESECTION Right 10/29/2016   Procedure: VIDEO ASSISTED THORACOSCOPY  (VATS)/Right upper lobe wedge RESECTION;  Surgeon: Delight Ovens, MD;  Location: [facility];  Service: Thoracic;  Laterality: Right;   VIDEO BRONCHOSCOPY N/A 10/29/2016   Procedure: VIDEO BRONCHOSCOPY;  Surgeon: Delight Ovens, MD;  Location: [facility] OR;  Service: Thoracic;  Laterality: N/A;   VIDEO BRONCHOSCOPY WITH ENDOBRONCHIAL NAVIGATION N/A 04/02/2016   Procedure: VIDEO BRONCHOSCOPY WITH ENDOBRONCHIAL NAVIGATION;  Surgeon: Delight Ovens, MD;  Location: [facility] OR;  Service: Thoracic;  Laterality: N/A;    No family history on file.  Social History   Socioeconomic History   Marital status: Single    Spouse name: Not on file   Number of children: Not on file   Years of education: Not on file   Highest education level: Not on file  Occupational History   Not on file  Tobacco Use   Smoking status: Every Day    Packs/day: 1.00    Years: 30.00    Total pack years: 30.00    Types: Cigarettes   Smokeless tobacco: Never   Tobacco comments:    Currently using Chantix  Vaping Use   Vaping Use: Every day  Substance and Sexual Activity   Alcohol use: Yes    Comment: daily- beer, 3-4 /day    Drug use: No   Sexual activity: Yes  Other Topics Concern   Not on file  Social History Narrative   Not on file   Social Determinants of Health   Financial Resource Strain: Not on file  Food Insecurity: Not on file  Transportation Needs: Not on file  Physical Activity: Not on file  Stress: Not on file  Social Connections: Not on file  Intimate Partner Violence: Not on file    Review of Systems  Constitutional: Negative.  Negative for diaphoresis.  HENT: Negative.    Eyes:  Negative for blurred vision, discharge and redness.  Respiratory:  Positive for shortness of breath.   Cardiovascular: Negative.  Negative for chest pain.  Gastrointestinal:  Negative for abdominal pain, nausea and vomiting.  Genitourinary: Negative.   Musculoskeletal:  Positive for joint pain. Negative for myalgias.   Skin:  Negative for rash.  Neurological:  Negative for tingling, loss of consciousness and weakness.  Endo/Heme/Allergies:  Negative for polydipsia.  Psychiatric/Behavioral: Negative.        02/27/2022   10:50 AM  Depression screen PHQ 2/9  Decreased Interest 0  Down, Depressed, Hopeless 0  PHQ - 2 Score 0          Objective    BP 122/74 (BP Location: Right Arm, Patient Position: Sitting, Cuff Size: Normal)   Pulse 96   Temp 97.7 F (36.5 C) (Temporal)   Ht 5\' 11"  (1.803 m)   Wt 179 lb 9.6 oz (81.5 kg)   SpO2 95%   BMI 25.05 kg/m   Physical Exam Constitutional:  General: He is not in acute distress.    Appearance: Normal appearance. He is not ill-appearing, toxic-appearing or diaphoretic.  HENT:     Head: Normocephalic and atraumatic.     Right Ear: External ear normal.     Left Ear: External ear normal.  Eyes:     General: No scleral icterus.       Right eye: No discharge.        Left eye: No discharge.     Extraocular Movements: Extraocular movements intact.     Conjunctiva/sclera: Conjunctivae normal.  Cardiovascular:     Rate and Rhythm: Normal rate and regular rhythm.  Pulmonary:     Effort: Pulmonary effort is normal. No respiratory distress.     Breath sounds: Normal breath sounds.  Abdominal:     Tenderness: There is no guarding.  Skin:    General: Skin is warm and dry.  Neurological:     Mental Status: He is alert and oriented to person, place, and time.  Psychiatric:        Mood and Affect: Mood normal.        Behavior: Behavior normal.         Assessment & Plan:   Problem List Items Addressed This Visit       Cardiovascular and Mediastinum   PVD (peripheral vascular disease) (HCC)   Relevant Medications   diltiazem (CARDIZEM CD) 120 MG 24 hr capsule   sildenafil (VIAGRA) 50 MG tablet   Essential hypertension - Primary   Relevant Medications   diltiazem (CARDIZEM CD) 120 MG 24 hr capsule   sildenafil (VIAGRA) 50 MG tablet      Respiratory   Acute bronchitis with COPD (HCC)     Other   Elevated cholesterol   Relevant Medications   diltiazem (CARDIZEM CD) 120 MG 24 hr capsule   sildenafil (VIAGRA) 50 MG tablet   Tobacco use   Shoulder pain    Return Return fasting for physical exam..   Continue follow-up with pulmonology and orthopedics.  Recommended discontinuation of smoking even though he is down to 3 cigarettes daily.  Advised that safe alcohol usage is no more than 2 and a given setting.  Mliss Sax, MD

## 2022-03-19 DIAGNOSIS — F17210 Nicotine dependence, cigarettes, uncomplicated: Secondary | ICD-10-CM | POA: Diagnosis not present

## 2022-03-19 DIAGNOSIS — Z122 Encounter for screening for malignant neoplasm of respiratory organs: Secondary | ICD-10-CM | POA: Diagnosis not present

## 2022-04-02 ENCOUNTER — Telehealth: Payer: Self-pay | Admitting: Family Medicine

## 2022-04-02 ENCOUNTER — Encounter: Payer: Self-pay | Admitting: Family Medicine

## 2022-04-02 ENCOUNTER — Encounter: Payer: BC Managed Care – PPO | Admitting: Family Medicine

## 2022-04-02 NOTE — Telephone Encounter (Signed)
Pt woke nauseated and throwing up he rescheduled. I told his wife he may not be charged the 50.00 because of the reason.

## 2022-04-02 NOTE — Telephone Encounter (Signed)
I called pt/wife to offer VV for vomiting. Pt declined stating he has nausea meds on hand from previous illness.   1st no show, fee waived, sending letter via mail

## 2022-04-13 ENCOUNTER — Encounter: Payer: BC Managed Care – PPO | Admitting: Family Medicine

## 2022-04-13 ENCOUNTER — Telehealth: Payer: Self-pay | Admitting: Family Medicine

## 2022-04-13 NOTE — Telephone Encounter (Signed)
Pt was a no show 9/22 for a cpe with Dr. Ethelene Hal, this is his second. Letter has been sent out.

## 2022-04-18 ENCOUNTER — Encounter: Payer: Self-pay | Admitting: Family Medicine

## 2022-04-18 NOTE — Telephone Encounter (Signed)
2nd no show, fee generated, final warning letter sent 

## 2022-05-28 ENCOUNTER — Encounter: Payer: Self-pay | Admitting: Family Medicine

## 2022-05-28 ENCOUNTER — Ambulatory Visit (INDEPENDENT_AMBULATORY_CARE_PROVIDER_SITE_OTHER): Payer: BC Managed Care – PPO | Admitting: Family Medicine

## 2022-05-28 VITALS — BP 138/72 | HR 87 | Temp 97.4°F | Resp 18 | Ht 69.0 in | Wt 185.0 lb

## 2022-05-28 DIAGNOSIS — Z125 Encounter for screening for malignant neoplasm of prostate: Secondary | ICD-10-CM

## 2022-05-28 DIAGNOSIS — I10 Essential (primary) hypertension: Secondary | ICD-10-CM | POA: Diagnosis not present

## 2022-05-28 DIAGNOSIS — Z0000 Encounter for general adult medical examination without abnormal findings: Secondary | ICD-10-CM | POA: Insufficient documentation

## 2022-05-28 DIAGNOSIS — Z716 Tobacco abuse counseling: Secondary | ICD-10-CM

## 2022-05-28 LAB — CBC
HCT: 40.5 % (ref 39.0–52.0)
Hemoglobin: 13.7 g/dL (ref 13.0–17.0)
MCHC: 33.8 g/dL (ref 30.0–36.0)
MCV: 89.6 fl (ref 78.0–100.0)
Platelets: 291 10*3/uL (ref 150.0–400.0)
RBC: 4.52 Mil/uL (ref 4.22–5.81)
RDW: 13.3 % (ref 11.5–15.5)
WBC: 6.5 10*3/uL (ref 4.0–10.5)

## 2022-05-28 LAB — BASIC METABOLIC PANEL
BUN: 14 mg/dL (ref 6–23)
CO2: 23 mEq/L (ref 19–32)
Calcium: 9.4 mg/dL (ref 8.4–10.5)
Chloride: 105 mEq/L (ref 96–112)
Creatinine, Ser: 1.05 mg/dL (ref 0.40–1.50)
GFR: 75.91 mL/min (ref >60.00)
Glucose, Bld: 122 mg/dL — ABNORMAL HIGH (ref 70–99)
Potassium: 4 mEq/L (ref 3.5–5.1)
Sodium: 137 mEq/L (ref 135–145)

## 2022-05-28 LAB — LIPID PANEL
Cholesterol: 222 mg/dL — ABNORMAL HIGH (ref 0–200)
HDL: 43.8 mg/dL (ref >39.00)
NonHDL: 178.43
Total CHOL/HDL Ratio: 5
Triglycerides: 203 mg/dL — ABNORMAL HIGH (ref 0.0–149.0)
VLDL: 40.6 mg/dL — ABNORMAL HIGH (ref 0.0–40.0)

## 2022-05-28 LAB — URINALYSIS, ROUTINE W REFLEX MICROSCOPIC
Bilirubin Urine: NEGATIVE
Hgb urine dipstick: NEGATIVE
Ketones, ur: NEGATIVE
Leukocytes,Ua: NEGATIVE
Nitrite: NEGATIVE
RBC / HPF: NONE SEEN (ref 0–?)
Specific Gravity, Urine: 1.02 (ref 1.000–1.030)
Total Protein, Urine: NEGATIVE
Urine Glucose: NEGATIVE
Urobilinogen, UA: 1 (ref 0.0–1.0)
pH: 6.5 (ref 5.0–8.0)

## 2022-05-28 LAB — HEPATIC FUNCTION PANEL
ALT: 18 U/L (ref 0–53)
AST: 15 U/L (ref 0–37)
Albumin: 4.3 g/dL (ref 3.5–5.2)
Alkaline Phosphatase: 71 U/L (ref 39–117)
Bilirubin, Direct: 0.1 mg/dL (ref 0.0–0.3)
Total Bilirubin: 0.5 mg/dL (ref 0.2–1.2)
Total Protein: 6.2 g/dL (ref 6.0–8.3)

## 2022-05-28 LAB — LDL CHOLESTEROL, DIRECT: Direct LDL: 155 mg/dL

## 2022-05-28 LAB — HEMOGLOBIN A1C: Hgb A1c MFr Bld: 6.1 % (ref 4.6–6.5)

## 2022-05-28 LAB — PSA: PSA: 0.78 ng/mL (ref 0.10–4.00)

## 2022-05-28 NOTE — Progress Notes (Signed)
Established Patient Office Visit  Subjective   Patient ID: [name] Ross, male    DOB: November 03, 1960  Age: 61 y.o. MRN: [id number]  Chief Complaint  Patient presents with   Annual Exam    CPE--- pt is fasting     HPI for physical exam today.  He is accompanied by his wife.  Continues to take his blood pressure medicines as directed but forgot them yesterday.  He has not been checking his pressures recently.  Continues to smoke about a pack a week.    Review of Systems  Constitutional: Negative.   HENT: Negative.    Eyes:  Negative for blurred vision, discharge and redness.  Respiratory: Negative.    Cardiovascular: Negative.   Gastrointestinal:  Negative for abdominal pain, blood in stool, constipation and melena.  Genitourinary: Negative.  Negative for dysuria, frequency and urgency.  Musculoskeletal: Negative.  Negative for myalgias.  Skin:  Negative for rash.  Neurological:  Negative for tingling, loss of consciousness, weakness and headaches.  Endo/Heme/Allergies:  Negative for polydipsia.      Objective:     BP 138/72 (BP Location: Right Arm)   Pulse 87   Temp (!) 97.4 F (36.3 C) (Temporal)   Resp 18   Ht 5\' 9"  (1.753 m)   Wt 185 lb (83.9 kg)   SpO2 95%   BMI 27.32 kg/m  BP Readings from Last 3 Encounters:  05/28/22 138/72  02/27/22 122/74  02/18/22 (!) 164/91   Wt Readings from Last 3 Encounters:  05/28/22 185 lb (83.9 kg)  02/27/22 179 lb 9.6 oz (81.5 kg)  09/01/19 176 lb (79.8 kg)      Physical Exam Constitutional:      General: He is not in acute distress.    Appearance: Normal appearance. He is not ill-appearing, toxic-appearing or diaphoretic.  HENT:     Head: Normocephalic and atraumatic.     Right Ear: External ear normal.     Left Ear: External ear normal.     Mouth/Throat:     Mouth: Mucous membranes are moist.     Pharynx: Oropharynx is clear. No oropharyngeal exudate or posterior oropharyngeal erythema.  Eyes:     General: No  scleral icterus.       Right eye: No discharge.        Left eye: No discharge.     Extraocular Movements: Extraocular movements intact.     Conjunctiva/sclera: Conjunctivae normal.     Pupils: Pupils are equal, round, and reactive to light.  Cardiovascular:     Rate and Rhythm: Normal rate and regular rhythm.  Pulmonary:     Effort: Pulmonary effort is normal. No respiratory distress.     Breath sounds: Normal breath sounds.  Abdominal:     General: Bowel sounds are normal.     Tenderness: There is no abdominal tenderness. There is no guarding.     Hernia: There is no hernia in the left inguinal area or right inguinal area.  Genitourinary:    Penis: Circumcised. No hypospadias, erythema, tenderness, discharge, swelling or lesions.      Testes:        Right: Mass, tenderness or swelling not present. Right testis is descended.        Left: Mass, tenderness or swelling not present. Left testis is descended.     Epididymis:     Right: Not inflamed or enlarged.     Left: Not inflamed or enlarged.     Prostate: Not enlarged, not  tender and no nodules present.     Rectum: Guaiac result negative. No mass, tenderness, anal fissure, external hemorrhoid or internal hemorrhoid. Normal anal tone.  Musculoskeletal:     Cervical back: No rigidity or tenderness.  Lymphadenopathy:     Lower Body: No right inguinal adenopathy. No left inguinal adenopathy.  Skin:    General: Skin is warm and dry.  Neurological:     Mental Status: He is alert and oriented to person, place, and time.  Psychiatric:        Mood and Affect: Mood normal.        Behavior: Behavior normal.      No results found for any visits on 05/28/22.    The ASCVD Risk score (Arnett DK, et al., 2019) failed to calculate for the following reasons:   Cannot find a previous HDL lab    Assessment & Plan:   Problem List Items Addressed This Visit       Cardiovascular and Mediastinum   Essential hypertension   Relevant  Orders   Basic metabolic panel   CBC     Other   Healthcare maintenance - Primary   Relevant Orders   Basic metabolic panel   CBC   Hepatic function panel   Lipid panel   Hemoglobin A1c   PSA   Urinalysis, Routine w reflex microscopic    Return in about 3 months (around 08/28/2022) for Return in 3 months with blood pressure cuff.  And list of blood pressures..  Patient will check and record his blood pressure a couple times a week.  Information was given on managing hypertension.  Continue diltiazem CD120 and olmesartan 40 mg daily.  GH on encouraged to continue exercise.  Encouraged smoking cessation.  Advised the flu, fall COVID and RSV vaccines.  Mliss Sax, MD

## 2022-05-29 NOTE — Progress Notes (Signed)
Ldl or bad cholesterol is elevated.  With this, along with elevated blood pressure and tobacco use, your 10-year risk score or having a heart attack or stroke is elevated to 23%.  Controlling your blood pressure and quitting smoking could greatly lower the score.  We also may need to consider treating your cholesterol.   Glucose is slightly elevated.  We will continue to follow this.     The 10-year ASCVD risk score (Arnett DK, et al., 2019) is: 23.2%   Values used to calculate the score:     Age: 61 years     Sex: Male     Is Non-Hispanic African American: No     Diabetic: No     Tobacco smoker: Yes     Systolic Blood Pressure: 315 mmHg     Is BP treated: Yes     HDL Cholesterol: 43.8 mg/dL     Total Cholesterol: 222 mg/dL

## 2022-05-31 ENCOUNTER — Telehealth: Payer: Self-pay

## 2022-05-31 DIAGNOSIS — E7800 Pure hypercholesterolemia, unspecified: Secondary | ICD-10-CM

## 2022-05-31 DIAGNOSIS — I739 Peripheral vascular disease, unspecified: Secondary | ICD-10-CM

## 2022-05-31 NOTE — Telephone Encounter (Signed)
Mr. [name] Ross and his wife would like to know if he will need to up his current does of Cholesterol medication (rosuvastatin 5 mg) or if you will changing the medication all together.   Please advise.

## 2022-06-01 MED ORDER — ROSUVASTATIN CALCIUM 20 MG PO TABS: 20.0000 mg | ORAL_TABLET | Freq: Every day | ORAL | 3 refills | Status: AC

## 2022-06-11 ENCOUNTER — Other Ambulatory Visit: Payer: Self-pay | Admitting: Family Medicine

## 2022-06-13 NOTE — Telephone Encounter (Signed)
I have updated the pharmacy on pt's chart

## 2022-06-20 MED ORDER — SILDENAFIL CITRATE 50 MG PO TABS: ORAL_TABLET | ORAL | 5 refills | Status: DC

## 2022-07-18 ENCOUNTER — Ambulatory Visit (HOSPITAL_BASED_OUTPATIENT_CLINIC_OR_DEPARTMENT_OTHER): Payer: BC Managed Care – PPO | Admitting: Orthopaedic Surgery

## 2022-08-06 ENCOUNTER — Other Ambulatory Visit: Payer: Self-pay | Admitting: Family Medicine

## 2022-08-07 ENCOUNTER — Ambulatory Visit: Payer: BC Managed Care – PPO | Admitting: Orthopaedic Surgery

## 2022-08-07 ENCOUNTER — Ambulatory Visit: Payer: Self-pay

## 2022-08-07 ENCOUNTER — Encounter: Payer: Self-pay | Admitting: Family Medicine

## 2022-08-07 VITALS — BP 123/87 | HR 99

## 2022-08-07 DIAGNOSIS — M25511 Pain in right shoulder: Secondary | ICD-10-CM

## 2022-08-07 DIAGNOSIS — M7541 Impingement syndrome of right shoulder: Secondary | ICD-10-CM | POA: Diagnosis not present

## 2022-08-07 MED ORDER — LIDOCAINE HCL 1 % IJ SOLN
0.5000 mL | INTRAMUSCULAR | Status: AC | PRN
  Administered 2022-08-07: .5 mL

## 2022-08-07 MED ORDER — METHYLPREDNISOLONE ACETATE 40 MG/ML IJ SUSP
40.0000 mg | INTRAMUSCULAR | Status: AC | PRN
  Administered 2022-08-07: 40 mg via INTRA_ARTICULAR

## 2022-08-07 MED ORDER — BUPIVACAINE HCL 0.25 % IJ SOLN
4.0000 mL | INTRAMUSCULAR | Status: AC | PRN
  Administered 2022-08-07: 4 mL via INTRA_ARTICULAR

## 2022-08-07 NOTE — Progress Notes (Signed)
Office Visit Note   Patient: [name] Ross           Date of Birth: 08-Sep-1960           MRN: [id number] Visit Date: 08/07/2022              Requested by: Libby Maw, MD [address] [place],  [place] [address] PCP: Libby Maw, MD   Assessment & Plan: Visit Diagnoses:  1. Right shoulder pain, unspecified chronicity     Plan: Subacromial injection performed right shoulder which she tolerated well.  He will return if he has ongoing problems.  He has had previous left shoulder surgery with rotator cuff repair in 2022 and we discussed he likely has similar pathology in his right shoulder.  Activity modification discussed.  Follow-Up Instructions: No follow-ups on file.   Orders:  Orders Placed This Encounter  Procedures   XR Shoulder Right   No orders of the defined types were placed in this encounter.     Procedures: Large Joint Inj: R subacromial bursa on 08/07/2022 9:13 AM Indications: pain Details: 22 G 1.5 in needle  Arthrogram: No  Medications: 4 mL bupivacaine 0.25 %; 40 mg methylPREDNISolone acetate 40 MG/ML; 0.5 mL lidocaine 1 % Outcome: tolerated well, no immediate complications Procedure, treatment alternatives, risks and benefits explained, specific risks discussed. Consent was given by the patient. Immediately prior to procedure a time out was called to verify the correct patient, procedure, equipment, support staff and site/side marked as required. Patient was prepped and draped in the usual sterile fashion.       Clinical Data: No additional findings.   Subjective: Chief Complaint  Patient presents with   Right Shoulder - Pain    HPI 62 year old male with 6 months history of shoulder pain he was racing his son in the swimming pool during the summer and had increased pain in his shoulder afterwards.  Pain with overhead activity outstretched reaching.  He has tried to rest it but has not improved.  He does use Tylenol  ibuprofen.  He is right-hand dominant.  He is also noticed numbness in the ring and small finger that wakes him up at night when he sleeps and noticed that it occurs with his elbow in extreme flexion.  Does not bother him during the day no weakness in grip strength during the day.  No associated neck pain.  Review of Systems positive COPD quit smoking 3 weeks ago.  Previous partial lobectomy.  All systems noncontributory HPI.   Objective: Vital Signs: BP 123/87   Pulse 99   Physical Exam Constitutional:      Appearance: He is well-developed.  HENT:     Head: Normocephalic and atraumatic.     Right Ear: External ear normal.     Left Ear: External ear normal.  Eyes:     Pupils: Pupils are equal, round, and reactive to light.  Neck:     Thyroid: No thyromegaly.     Trachea: No tracheal deviation.  Cardiovascular:     Rate and Rhythm: Normal rate.  Pulmonary:     Effort: Pulmonary effort is normal.     Breath sounds: No wheezing.  Abdominal:     General: Bowel sounds are normal.     Palpations: Abdomen is soft.  Musculoskeletal:     Cervical back: Neck supple.  Skin:    General: Skin is warm and dry.     Capillary Refill: Capillary refill takes less than  2 seconds.  Neurological:     Mental Status: He is alert and oriented to person, place, and time.  Psychiatric:        Behavior: Behavior normal.        Thought Content: Thought content normal.        Judgment: Judgment normal.     Ortho Exam positive impingement right shoulder pain with resisted supraspinatus testing long head of the biceps minimally tender.  Specialty Comments:  No specialty comments available.  Imaging: No results found.   PMFS History: Patient Active Problem List   Diagnosis Date Noted   Healthcare maintenance 05/28/2022   Elevated cholesterol 02/27/2022   Tobacco use 02/27/2022   Acute bronchitis with COPD ([place]) 02/27/2022   PVD (peripheral vascular disease) ([place]) 02/27/2022   Essential  hypertension 02/27/2022   Shoulder pain 02/27/2022   Encounter for chest tube removal    Pneumothorax    Postop check    Acid fast bacillus    Smoker    Chronic bronchitis ([place])    S/P partial lobectomy of lung 10/29/2016   Nodule of right lung 10/25/2016   COPD (chronic obstructive pulmonary disease) with emphysema ([place]) 10/25/2016   Past Medical History:  Diagnosis Date   Back pain    Chronic bronchitis (HCC)    COPD (chronic obstructive pulmonary disease) (HCC)    GERD (gastroesophageal reflux disease)    Multiple lung nodules    Neuromuscular disorder (HCC)    leg- pain causing a reduced stamina for walking , due to leg pain.    Tobacco abuse     No family history on file.  Past Surgical History:  Procedure Laterality Date   ABDOMINAL AORTOGRAM N/A 05/28/2017   Procedure: ABDOMINAL AORTOGRAM;  Surgeon: Serafina Mitchell, MD;  Location: [facility];  Service: Cardiovascular;  Laterality: N/A;   APPENDECTOMY  1980   BACK SURGERY  1980's   LOWER EXTREMITY ANGIOGRAPHY Bilateral 05/28/2017   Procedure: Lower Extremity Angiography;  Surgeon: Serafina Mitchell, MD;  Location: [facility];  Service: Cardiovascular;  Laterality: Bilateral;   PERIPHERAL VASCULAR INTERVENTION Right 05/28/2017   Procedure: PERIPHERAL VASCULAR INTERVENTION;  Surgeon: Serafina Mitchell, MD;  Location: [facility];  Service: Cardiovascular;  Laterality: Right;  ext iliac   VIDEO ASSISTED THORACOSCOPY (VATS)/WEDGE RESECTION Right 10/29/2016   Procedure: VIDEO ASSISTED THORACOSCOPY (VATS)/Right upper lobe wedge RESECTION;  Surgeon: Grace Isaac, MD;  Location: [facility];  Service: Thoracic;  Laterality: Right;   VIDEO BRONCHOSCOPY N/A 10/29/2016   Procedure: VIDEO BRONCHOSCOPY;  Surgeon: Grace Isaac, MD;  Location: [facility];  Service: Thoracic;  Laterality: N/A;   VIDEO BRONCHOSCOPY WITH ENDOBRONCHIAL NAVIGATION N/A 04/02/2016   Procedure: VIDEO BRONCHOSCOPY WITH ENDOBRONCHIAL NAVIGATION;   Surgeon: Grace Isaac, MD;  Location: [facility];  Service: Thoracic;  Laterality: N/A;   Social History   Occupational History   Not on file  Tobacco Use   Smoking status: Every Day    Packs/day: 1.00    Years: 30.00    Total pack years: 30.00    Types: Cigarettes   Smokeless tobacco: Never   Tobacco comments:    Currently using Chantix  Vaping Use   Vaping Use: Every day  Substance and Sexual Activity   Alcohol use: Yes    Comment: daily- beer, 3-4 /day    Drug use: No   Sexual activity: Yes

## 2022-08-08 ENCOUNTER — Other Ambulatory Visit (HOSPITAL_COMMUNITY): Payer: Self-pay

## 2022-08-08 MED ORDER — MELOXICAM 15 MG PO TABS
15.0000 mg | ORAL_TABLET | Freq: Every day | ORAL | 1 refills | Status: DC | PRN
  Filled 2022-08-08: qty 30, 30d supply, fill #0

## 2022-08-08 MED ORDER — OLMESARTAN MEDOXOMIL 40 MG PO TABS
40.0000 mg | ORAL_TABLET | Freq: Every day | ORAL | 0 refills | Status: DC
  Filled 2022-08-08: qty 90, 90d supply, fill #0

## 2022-08-08 MED ORDER — DILTIAZEM HCL ER COATED BEADS 120 MG PO CP24
120.0000 mg | ORAL_CAPSULE | Freq: Every day | ORAL | 0 refills | Status: DC
  Filled 2022-08-08: qty 90, 90d supply, fill #0

## 2022-08-10 ENCOUNTER — Telehealth: Payer: Self-pay | Admitting: Family Medicine

## 2022-08-10 DIAGNOSIS — M25519 Pain in unspecified shoulder: Secondary | ICD-10-CM

## 2022-08-10 DIAGNOSIS — I10 Essential (primary) hypertension: Secondary | ICD-10-CM

## 2022-08-10 NOTE — Telephone Encounter (Signed)
Pt's wife is calling in stating she needs all of his scripts  Rx #: [id number]  diltiazem (CARDIZEM CD) 120 MG 24 hr capsule [[id number]],  Rx #: [id number]  meloxicam (MOBIC) 15 MG tablet [[id number]]   Rx #: [id number]  olmesartan (BENICAR) 40 MG tablet [[id number]]  Sent over to [facility] Address: [address] [place], [place] [address] Phone: [phone] Celso Sickle, wife at [phone]

## 2022-08-13 ENCOUNTER — Other Ambulatory Visit (HOSPITAL_COMMUNITY): Payer: Self-pay

## 2022-08-13 MED ORDER — DILTIAZEM HCL ER COATED BEADS 120 MG PO CP24: 120.0000 mg | ORAL_CAPSULE | Freq: Every day | ORAL | 0 refills | Status: DC

## 2022-08-13 MED ORDER — MELOXICAM 15 MG PO TABS: 15.0000 mg | ORAL_TABLET | Freq: Every day | ORAL | 1 refills | Status: AC | PRN

## 2022-08-13 MED ORDER — OLMESARTAN MEDOXOMIL 40 MG PO TABS: 40.0000 mg | ORAL_TABLET | Freq: Every day | ORAL | 0 refills | Status: AC

## 2022-08-14 ENCOUNTER — Telehealth: Payer: Self-pay | Admitting: Family Medicine

## 2022-08-14 NOTE — Telephone Encounter (Signed)
Caller Name: pt  Call back phone #: [phone]  Reason for Call: The wrong pharmacy was selected they need it to be [facility]. on [address]. Please send again to this pharmacy.

## 2022-09-03 ENCOUNTER — Telehealth: Payer: Self-pay | Admitting: Family Medicine

## 2022-09-03 ENCOUNTER — Ambulatory Visit: Payer: BC Managed Care – PPO | Admitting: Family Medicine

## 2022-09-03 NOTE — Telephone Encounter (Signed)
3rd no show, fee generated (no fee for Medicaid)  No show history 04/02/2022 04/13/2022 09/03/2022  Do you want me to proceed with dismissal?

## 2022-09-03 NOTE — Telephone Encounter (Signed)
Pt was a no show for an OV with Dr  Ethelene Hal on 09/03/22, I sent a letter.

## 2022-11-25 NOTE — Progress Notes (Signed)
 Vascular Surgery Clinic Note    Reason for Visit:  Surveillance of right common and external iliac artery stenting    Assessment and Plan:   62 y.o. male with PMH significant for HTN, HLD, PSVT, aortic root dilation and PAD presenting for evaluation of bilateral hip pain when ambulating.    Imaging studies were discussed with the patient. Specifically, he has normal velocities through his stent without evidence of stenosis. He also has palpable pulses. We discussed the need for surveillance and given this is his first visit with us , I will obtain repeat duplex with ABI in 6 months. After that, he will be appropriate for yearly surveillance.     - Follow up in 6 months with ABI and duplex  - Continue statin, ASA, Plavix  - May continue his Pletal , if not having benefit within 6 weeks, may discontinue  - Advised to call me or return sooner if he develops rest pain or tissue loss     HPI:   [name] Ross is a 62 y.o. male with PMH significant for HTN, HLD, PSVT, aortic root dilation and PAD presenting for evaluation of bilateral hip pain when ambulating.  He previously underwent right common and external iliac stenting in 2018 at [facility] for claudication. The pain at that time was in his bilateral hips with short distance ambulation (50 feet or so). A few weeks ago he developed the same pain in his hips when ambulating, so he presented today for evaluation. He denies pain in his thighs, calves, or feet. No rest pain or tissue loss.   He takes ASA, Plavix , and Pletal . He was taking the Pletal  once daily but has started back Ross it twice a day. This was started after his stent was placed. He also takes rosuvastatin  20 mg daily. He quit smoking about a year ago. He drives 1.5 hours to work a day.    Iliac 11/26/2022 R ABI 0.75 -- CIA PSV 178 cm/s, EIA PSV 105 cm/s; multiphasic signals L 0.76; multiphasic signals   No past medical history Ross file.  No past surgical history Ross file.  Prior to  Admission medications   Medication Sig Start Date End Date Taking? Authorizing Provider  amLODIPine (NORVASC) 5 mg tablet Take 1 tablet (5 mg total) by mouth daily. 10/11/22 10/11/23  Wadie Addie Stake, MD  cilostazoL  (PLETAL ) 100 mg tablet Take 1 tablet (100 mg total) by mouth 2 (two) times a day. 10/11/22 10/11/23  Wadie Addie Stake, MD  clopidogreL  (PLAVIX ) 75 mg tablet Take 1 tablet (75 mg total) by mouth daily. 10/11/22 10/11/23  Wadie Addie Stake, MD  dilTIAZem  (CARDIZEM  CD) 120 mg 24 hr capsule Take 1 capsule (120 mg total) by mouth daily. 10/11/22 10/11/23  Wadie Addie Stake, MD  meloxicam  (MOBIC ) 15 mg tablet Take 1 tablet (15 mg total) by mouth nightly as needed for moderate pain (4-6). 10/11/22 10/11/23  Wadie Addie Stake, MD  olmesartan  (BENICAR ) 40 mg tablet Take 1 tablet (40 mg total) by mouth daily. 10/11/22 10/11/23  Wadie Addie Stake, MD  omeprazole  (PriLOSEC ) 40 mg DR capsule Take 1 capsule (40 mg total) by mouth daily. 10/11/22   Wadie Addie Stake, MD  rosuvastatin  (CRESTOR ) 20 mg tablet Take 1 tablet (20 mg total) by mouth daily. 10/11/22 10/11/23  Wadie Addie Stake, MD  sildenafiL  (VIAGRA ) 50 mg tablet Take 50 mg by mouth daily as needed for erectile dysfunction.    HISTORICAL PROVIDER, [facility]    Allergies  Allergen Reactions  .  Metoprolol Shortness Of Breath  . Codeine     Allscripts Description: Codeine Sulfate TABS  . Demerol [Meperidine] Rash    Social History   Tobacco Use  . Smoking status: Former    Types: Cigarettes    Passive exposure: Past  . Smokeless tobacco: Never  . Tobacco comments:    Quit SMOKING 6 MONTHS AGO  Substance Use Topics  . Alcohol use: Yes    Alcohol/week: 8.0 standard drinks of alcohol    Types: 8 Cans of beer per week    Comment: ADVISED TO REDUCE  . Drug use: Never    No family history Ross file.    Review systems:   Complete review of systems negative other than what was mentioned in HPI and PMH.     Physical Exam:   BP (!) 147/93 (BP Location: Left arm)   Pulse 86   Ht 1.803 m (5' 11)   Wt 84.5 kg (186 lb 4.8 oz)   SpO2 95%   BMI 25.98 kg/m   Alert, oriented  Neck soft and supple;  Unlabored Ross RA Radial pulses 2+ Femoral pulses 2+  DP and PT pulses 2+  Extremities: No edema    Imaging:   Images were personally reviewed and interpreted   Electronically signed by:  Ulanda Almarie Ober, MD 11/26/2022 12:29 PM

## 2023-04-20 ENCOUNTER — Other Ambulatory Visit: Payer: Self-pay | Admitting: Family Medicine

## 2023-05-25 ENCOUNTER — Other Ambulatory Visit: Payer: Self-pay | Admitting: Family Medicine

## 2023-06-21 NOTE — Progress Notes (Signed)
 Lung Cancer Screening Shared Decision Making   Review of Eligibility Criteria: He does not meet all of the criteria for Lung Cancer Screening. (75-63 years old, 20 or more pack year history, current smoker or quit within the past 15 years.) Age: 62 y.o. Pack Years: Missing Smoking Status: Former Smoker (quit date: missing)

## 2023-10-06 ENCOUNTER — Other Ambulatory Visit: Payer: Self-pay | Admitting: Family Medicine

## 2023-10-06 DIAGNOSIS — M25519 Pain in unspecified shoulder: Secondary | ICD-10-CM

## 2024-05-31 ENCOUNTER — Emergency Department (HOSPITAL_COMMUNITY)
Admission: EM | Admit: 2024-05-31 | Discharge: 2024-05-31 | Disposition: A | Discharge status: Home / Self Care | Attending: Emergency Medicine | Admitting: Emergency Medicine

## 2024-05-31 ENCOUNTER — Emergency Department (HOSPITAL_COMMUNITY)

## 2024-05-31 ENCOUNTER — Encounter (HOSPITAL_COMMUNITY): Payer: Self-pay | Admitting: Radiology

## 2024-05-31 DIAGNOSIS — W19XXXA Unspecified fall, initial encounter: Secondary | ICD-10-CM

## 2024-05-31 DIAGNOSIS — I10 Essential (primary) hypertension: Secondary | ICD-10-CM | POA: Diagnosis not present

## 2024-05-31 DIAGNOSIS — W01198A Fall on same level from slipping, tripping and stumbling with subsequent striking against other object, initial encounter: Secondary | ICD-10-CM | POA: Insufficient documentation

## 2024-05-31 DIAGNOSIS — J449 Chronic obstructive pulmonary disease, unspecified: Secondary | ICD-10-CM | POA: Diagnosis not present

## 2024-05-31 DIAGNOSIS — S40022A Contusion of left upper arm, initial encounter: Secondary | ICD-10-CM

## 2024-05-31 DIAGNOSIS — Z720 Tobacco use: Secondary | ICD-10-CM | POA: Insufficient documentation

## 2024-05-31 DIAGNOSIS — Z7901 Long term (current) use of anticoagulants: Secondary | ICD-10-CM | POA: Diagnosis not present

## 2024-05-31 DIAGNOSIS — S59912A Unspecified injury of left forearm, initial encounter: Secondary | ICD-10-CM | POA: Diagnosis present

## 2024-05-31 DIAGNOSIS — S5012XA Contusion of left forearm, initial encounter: Secondary | ICD-10-CM | POA: Insufficient documentation

## 2024-05-31 LAB — CBC
HCT: 33.5 % — ABNORMAL LOW (ref 39.0–52.0)
Hemoglobin: 11.5 g/dL — ABNORMAL LOW (ref 13.0–17.0)
MCH: 30.4 pg (ref 26.0–34.0)
MCHC: 34.3 g/dL (ref 30.0–36.0)
MCV: 88.6 fL (ref 80.0–100.0)
Platelets: 271 K/uL (ref 150–400)
RBC: 3.78 MIL/uL — ABNORMAL LOW (ref 4.22–5.81)
RDW: 11.9 % (ref 11.5–15.5)
WBC: 7.2 K/uL (ref 4.0–10.5)
nRBC: 0 % (ref 0.0–0.2)

## 2024-05-31 LAB — PROTIME-INR
INR: 1 (ref 0.8–1.2)
Prothrombin Time: 13.4 s (ref 11.4–15.2)

## 2024-05-31 LAB — I-STAT CHEM 8, ED
BUN: 13 mg/dL (ref 8–23)
Calcium, Ion: 1.15 mmol/L (ref 1.15–1.40)
Chloride: 99 mmol/L (ref 98–111)
Creatinine, Ser: 1.2 mg/dL (ref 0.61–1.24)
Glucose, Bld: 113 mg/dL — ABNORMAL HIGH (ref 70–99)
HCT: 33 % — ABNORMAL LOW (ref 39.0–52.0)
Hemoglobin: 11.2 g/dL — ABNORMAL LOW (ref 13.0–17.0)
Potassium: 3.9 mmol/L (ref 3.5–5.1)
Sodium: 132 mmol/L — ABNORMAL LOW (ref 135–145)
TCO2: 21 mmol/L — ABNORMAL LOW (ref 22–32)

## 2024-05-31 LAB — COMPREHENSIVE METABOLIC PANEL WITH GFR
ALT: 20 U/L (ref 0–44)
AST: 19 U/L (ref 15–41)
Albumin: 3.8 g/dL (ref 3.5–5.0)
Alkaline Phosphatase: 55 U/L (ref 38–126)
Anion gap: 14 (ref 5–15)
BUN: 13 mg/dL (ref 8–23)
CO2: 18 mmol/L — ABNORMAL LOW (ref 22–32)
Calcium: 9 mg/dL (ref 8.9–10.3)
Chloride: 100 mmol/L (ref 98–111)
Creatinine, Ser: 0.99 mg/dL (ref 0.61–1.24)
GFR, Estimated: >60 mL/min (ref >60)
Glucose, Bld: 113 mg/dL — ABNORMAL HIGH (ref 70–99)
Potassium: 3.9 mmol/L (ref 3.5–5.1)
Sodium: 132 mmol/L — ABNORMAL LOW (ref 135–145)
Total Bilirubin: 0.6 mg/dL (ref 0.0–1.2)
Total Protein: 5.9 g/dL — ABNORMAL LOW (ref 6.5–8.1)

## 2024-05-31 LAB — SAMPLE TO BLOOD BANK

## 2024-05-31 LAB — I-STAT CG4 LACTIC ACID, ED: Lactic Acid, Venous: 1.4 mmol/L (ref 0.5–1.9)

## 2024-05-31 LAB — ETHANOL: Alcohol, Ethyl (B): 149 mg/dL — ABNORMAL HIGH (ref <15)

## 2024-05-31 NOTE — Progress Notes (Signed)
 Orthopedic Tech Progress Note Patient Details:  [name] Ross [birth year] [id number]  Level II trauma, no ortho tech orders at this time.  Patient ID: [name] Ross, male   DOB: [birth year], 62 y.o.   MRN: [id number]  [name] Ross 05/31/2024, 5:08 PM

## 2024-05-31 NOTE — ED Notes (Signed)
 X-ray at bedside.

## 2024-05-31 NOTE — ED Notes (Signed)
 Compression dressing placed to left arm.

## 2024-05-31 NOTE — ED Notes (Signed)
 Pt back from CT

## 2024-05-31 NOTE — ED Triage Notes (Signed)
 Pt was unloading something off a lifted truck on a ladder. He landed on a pallet on his chest. He rolled off a pallet ground level and hit his head on the ground. Head hit the ground from pallet level to the ground. He takes Plavix  daily.  Pt is alert and oriented on arrival. No LOC and the fall was witnessed. He has a hematoma to the left forarm. No broken skin  110/60 98 122 CBG 18  Pt has drank 4 beer today.

## 2024-05-31 NOTE — ED Provider Notes (Signed)
 [facility] EMERGENCY DEPARTMENT AT [facility] Provider Note  MDM   HPI/ROS:  [name] Ross is a 63 y.o. male with a PMH of HTN, HLD, PSVT, aortic root dilation, PAD on Plavix  , COPD, GERD, multiple lung nodules who presents to arrives via EMS as a level 2 trauma after sustaining injuries in a fall earlier this afternoon. History obtained from EMS as well as patient.  Briefly, the patient was unloading something off of a lifted truck on an 8 foot ladder, the air compressor then landed on the ladder causing him to fall onto a pallet, he then rolled off the pallet and then rolled off the bed of the truck hitting his head on the ground.  He states that he fell onto his left side.  On arrival, patient is GCS of 15, HDS, ABCs intact. A cervical collar was  in place at the time of arrival.   Head to toe primary assessment was performed and findings, detailed below, were notable for ABCs intact, small abrasion/puncture wound on his posterior scalp that is hemostatic, large left forearm hematoma (outlined), neurovascularly intact, 2+ radial pulses bilaterally.  Laboratory studies were obtained and imaging, including trauma scans and plain films were ordered, as detailed below. CBC, CMP, lactic acid all without medically relevant abnormalities.  Ethanol elevated at 149.  CT cervical spine, head both without any traumatic injuries or any acute abnormalities.  Forearm x-ray without any acute fracture.  CXR without any acute cardiopulmonary abnormality. Compression dressing was placed on patient's hematoma, on reassessment approximately 1 hour later, hematoma had decreased in size, was not rapidly expanding, sensation is intact, patient was NVIT with 2+ distal radial pulses.  At this time given and hematoma was not expanding, there is no compromise of neurovascular status, no indication for CTA imaging at this time or vascular surgery involvement.  Patient instructed to keep a pressure dressing on it for  approximately 24 hours, strict return precautions were given if hematoma continue to expand, or there were any neurologic deficits that he found. Patient was clinically sober, able to ambulate without difficulty, able to answer all questions appropriately at the time of discharge, daughter in the room confirmed this and confirmed that she would be the one taking him home.   Interpretations, interventions, and the patient's course of care are documented below.       Disposition: Discharge  Clinical Impression:  1. Fall, initial encounter   2. Hematoma of arm, left, initial encounter     Clinical Complexity A medically appropriate history, review of systems, and physical exam was performed.  My independent interpretations of EKG, labs, and radiology are documented in the ED course above.   If decision rules were used in this patient's evaluation, they are listed below.   Click here for ABCD2, HEART and other calculatorsREFRESH Note before signing   Patient's presentation is most consistent with acute complicated illness / injury requiring diagnostic workup.  Medical Decision Making Amount and/or Complexity of Data Reviewed Labs: ordered. Radiology: ordered.    HPI/ROS      See MDM section for pertinent HPI and ROS. A complete ROS was performed with pertinent positives/negatives noted above.   Past Medical History:  Diagnosis Date   Back pain    Chronic bronchitis (HCC)    COPD (chronic obstructive pulmonary disease) (HCC)    GERD (gastroesophageal reflux disease)    Multiple lung nodules    Neuromuscular disorder (HCC)    leg- pain causing a reduced stamina  for walking , due to leg pain.    Tobacco abuse     Past Surgical History:  Procedure Laterality Date   ABDOMINAL AORTOGRAM N/A 05/28/2017   Procedure: ABDOMINAL AORTOGRAM;  Surgeon: Serene Gaile ORN, MD;  Location: [facility] INVASIVE CV LAB;  Service: Cardiovascular;  Laterality: N/A;   APPENDECTOMY  1980   BACK  SURGERY  1980's   LOWER EXTREMITY ANGIOGRAPHY Bilateral 05/28/2017   Procedure: Lower Extremity Angiography;  Surgeon: Serene Gaile ORN, MD;  Location: [facility] INVASIVE CV LAB;  Service: Cardiovascular;  Laterality: Bilateral;   PERIPHERAL VASCULAR INTERVENTION Right 05/28/2017   Procedure: PERIPHERAL VASCULAR INTERVENTION;  Surgeon: Serene Gaile ORN, MD;  Location: [facility] INVASIVE CV LAB;  Service: Cardiovascular;  Laterality: Right;  ext iliac   VIDEO ASSISTED THORACOSCOPY (VATS)/WEDGE RESECTION Right 10/29/2016   Procedure: VIDEO ASSISTED THORACOSCOPY (VATS)/Right upper lobe wedge RESECTION;  Surgeon: Dallas KATHEE Jude, MD;  Location: [facility] OR;  Service: Thoracic;  Laterality: Right;   VIDEO BRONCHOSCOPY N/A 10/29/2016   Procedure: VIDEO BRONCHOSCOPY;  Surgeon: Dallas KATHEE Jude, MD;  Location: [facility] OR;  Service: Thoracic;  Laterality: N/A;   VIDEO BRONCHOSCOPY WITH ENDOBRONCHIAL NAVIGATION N/A 04/02/2016   Procedure: VIDEO BRONCHOSCOPY WITH ENDOBRONCHIAL NAVIGATION;  Surgeon: Dallas KATHEE Jude, MD;  Location: [facility] OR;  Service: Thoracic;  Laterality: N/A;      Physical Exam   Vitals:   05/31/24 1635 05/31/24 1636 05/31/24 1638 05/31/24 1641  BP:  128/72 127/71 127/71  Pulse:   93 92  Resp:   19 19  Temp:   98.1 F (36.7 C) 98.1 F (36.7 C)  TempSrc:   Oral Oral  SpO2:   93% 93%  Weight: 82.6 kg     Height: 5' 11 (1.803 m)       Physical Exam Vitals and nursing note reviewed.  Constitutional:      General: He is not in acute distress.    Appearance: He is well-developed.  HENT:     Head: Normocephalic and atraumatic.  Eyes:     Conjunctiva/sclera: Conjunctivae normal.  Cardiovascular:     Rate and Rhythm: Normal rate and regular rhythm.     Heart sounds: No murmur heard. Pulmonary:     Effort: Pulmonary effort is normal. No respiratory distress.     Breath sounds: Normal breath sounds.  Abdominal:     Palpations: Abdomen is soft.     Tenderness: There is no abdominal tenderness.   Musculoskeletal:     Cervical back: Neck supple.     Comments: Left forearm hematoma, 2+ distal radial pulses  Skin:    General: Skin is warm and dry.     Capillary Refill: Capillary refill takes less than 2 seconds.  Neurological:     General: No focal deficit present.     Mental Status: He is alert and oriented to person, place, and time. Mental status is at baseline.      Procedures   If procedures were preformed on this patient, they are listed below:  Procedures   Please note that this documentation was produced with the assistance of voice-to-text technology and may contain errors.     Billy Pal, MD 06/02/24 1020    Tegeler, Lonni PARAS, MD 06/05/24 1252

## 2024-05-31 NOTE — Progress Notes (Signed)
 Chaplain responds to level 2 trauma, fall on thinners, and provides compassionate presence as medical team cares for pt. No family or loved ones present.

## 2024-05-31 NOTE — Discharge Instructions (Signed)
 Mann D [facility]if[facility]:  Thank you for allowing us  to take care of you today.  We hope you begin feeling better soon. You were seen today for a fall.  While you are here we performed a physical examination, extensive traumatic workup including lab work, CT imaging and x-ray imaging.  Thankfully, there is no traumatic injuries found.  Your CT of your head and cervical spine were both negative, x-ray imagings were negative for any fractures, lung processes, and your lab work was reassuring against infection or electrolyte abnormalities.  Continue to wear the pressure dressing on your left arm for the next 24 hours.  If you begin to experience worsening hematoma, neurologic deficits such as numbness, tingling, decreased mobility in your hand please return to the emergency department for further evaluation.  Given the fall, you may likely be sore with some musculoskeletal strain over the next several days.  Please take Tylenol /ibuprofen as needed for alternating every 4 hours as needed for pain  To-Do:  Please follow-up with your primary doctor within the next 2-3 days. It is important that you review any labs or imaging results (if any) that you had today with them. Your preliminary imaging results (if any) are attached. Please return to the Emergency Department or call 911 if you experience chest pain, shortness of breath, severe pain, severe fever, altered mental status, or have any reason to think that you need emergency medical care.  Thank you again.  Hope you feel better soon.  Department of Emergency Medicine

## 2024-05-31 NOTE — ED Notes (Signed)
 Pt to CT
